# Patient Record
Sex: Male | Born: 1980 | Race: Black or African American | Hispanic: No | Smoking: Never smoker
Health system: Southern US, Community
[De-identification: ages and names within clinical notes are randomized; demographics above are authoritative.]

## PROBLEM LIST (undated history)

## (undated) HISTORY — PX: HERNIA REPAIR: SHX51

---

## 2005-03-01 ENCOUNTER — Emergency Department (HOSPITAL_COMMUNITY): Admission: EM | Admit: 2005-03-01 | Discharge: 2005-03-01 | Payer: Self-pay | Admitting: Family Medicine

## 2005-12-09 ENCOUNTER — Emergency Department (HOSPITAL_COMMUNITY): Admission: EM | Admit: 2005-12-09 | Discharge: 2005-12-10 | Payer: Self-pay | Admitting: Emergency Medicine

## 2006-12-27 ENCOUNTER — Ambulatory Visit: Payer: Self-pay | Admitting: *Deleted

## 2006-12-27 ENCOUNTER — Ambulatory Visit: Payer: Self-pay | Admitting: Internal Medicine

## 2007-02-14 ENCOUNTER — Emergency Department (HOSPITAL_COMMUNITY): Admission: EM | Admit: 2007-02-14 | Discharge: 2007-02-14 | Payer: Self-pay | Admitting: *Deleted

## 2007-05-13 ENCOUNTER — Emergency Department (HOSPITAL_COMMUNITY): Admission: EM | Admit: 2007-05-13 | Discharge: 2007-05-13 | Payer: Self-pay | Admitting: Emergency Medicine

## 2007-05-14 ENCOUNTER — Emergency Department (HOSPITAL_COMMUNITY): Admission: EM | Admit: 2007-05-14 | Discharge: 2007-05-14 | Payer: Self-pay | Admitting: Emergency Medicine

## 2007-06-13 ENCOUNTER — Emergency Department (HOSPITAL_COMMUNITY): Admission: EM | Admit: 2007-06-13 | Discharge: 2007-06-13 | Payer: Self-pay | Admitting: Emergency Medicine

## 2007-06-15 ENCOUNTER — Emergency Department (HOSPITAL_COMMUNITY): Admission: EM | Admit: 2007-06-15 | Discharge: 2007-06-15 | Payer: Self-pay | Admitting: Emergency Medicine

## 2007-06-16 ENCOUNTER — Inpatient Hospital Stay (HOSPITAL_COMMUNITY): Admission: EM | Admit: 2007-06-16 | Discharge: 2007-06-20 | Payer: Self-pay | Admitting: Emergency Medicine

## 2007-06-16 ENCOUNTER — Ambulatory Visit: Payer: Self-pay | Admitting: Internal Medicine

## 2007-06-27 ENCOUNTER — Ambulatory Visit: Payer: Self-pay | Admitting: Internal Medicine

## 2007-06-27 DIAGNOSIS — L0292 Furuncle, unspecified: Secondary | ICD-10-CM | POA: Insufficient documentation

## 2007-06-27 DIAGNOSIS — L0293 Carbuncle, unspecified: Secondary | ICD-10-CM

## 2007-07-07 ENCOUNTER — Ambulatory Visit: Payer: Self-pay | Admitting: Internal Medicine

## 2007-10-20 ENCOUNTER — Emergency Department (HOSPITAL_COMMUNITY): Admission: EM | Admit: 2007-10-20 | Discharge: 2007-10-20 | Payer: Self-pay | Admitting: Emergency Medicine

## 2010-10-28 NOTE — Discharge Summary (Signed)
[name], Ramirez               ACCOUNT NO.:  [id number]   MEDICAL RECORD NO.:  [id number]          PATIENT TYPE:  INP   LOCATION:  6730                         FACILITY:  [facility]   PHYSICIAN:  Zara Council, MD      DATE OF BIRTH:  May 08, 1981   DATE OF ADMISSION:  06/16/2007  DATE OF DISCHARGE:  06/20/2007                               DISCHARGE SUMMARY   DISCHARGE DIAGNOSES:  1. Right axillary abscess.  2. Recurrent boils.  3. Borderline elevated creatinine with a family history of systemic      lupus erythematosus.  4. Hypertension most likely secondary to pain, resolved on discharge.   DISCHARGE MEDICATIONS:  1. Bactrim DS 2 tablets twice a day for 1 week.  2. Vicodin 5/325 mg 1 - 2 tab up to four times a day for pain.   DISPOSITION AND FOLLOWUP:  1. The patient is to follow up with Dr. Gerrit Friends, phone number [phone]      for a followup on I&D.  2. The patient is to follow up at the [facility] with Dr. Polly Cobia on Monday, June 27, 2007.  At the      followup visit, the patient is to be reviewed for resolution of his      right axillary abscess.  At the followup visit the total duration      of the antibiotics will be determined.   PROCEDURES:  No special procedures were undertaken during this admission  in regards to radiological studies.   The patient underwent right axillary abscess incision and drainage with  open packing on June 16, 2007, under Dr. Velora Heckler.  The  procedure was done at the bedside in the ER at [facility].   CONSULTATIONS:  A surgical consultation was made with Dr. Velora Heckler  for right axillary abscess.  As noted above, the patient subsequently  underwent incision and drainage with open packing.   ADMISSION HISTORY:  Mr. Stanley Ramirez is a 30 year old gentleman with no  significant past medical history, who came to the ED 3 days prior to  admission for drainage of a right axillary abscess that had  appeared the  day before.  At that time, it was I&D'd and packed.  He was sent home on  Bactrim.  He came back again 1 day prior to admission for removal of  packing.  The area seemed enlarged and was still draining with  surrounding erythema.  He was given 1 dose of vancomycin, and his  Bactrim dose was doubled, and his culture had grown methicillin-  sensitive Staphylococcus aureus, sensitive to Bactrim.  He came back on  the day of admission with no improvement and moderate pain.  He still  had pigtail drainage, and he had mild subjective fever but no chills,  and he was complaining of headaches.   ADMISSION PHYSICAL:  VITAL SIGNS:  Temperature 99, blood pressure  145/89, pulse 102, and respiratory rate 20.  Oxygen sat 98% on room air.  GENERAL:  NAD.  Holding right arm above his  head.  EYES:  PERRLA.  EOMI.  Anicteric.  NECK:  Supple.  CHEST:  Clear to auscultation bilaterally.  Good air movement.  CARDIOVASCULAR:  Regular rate and rhythm.  No murmurs, rubs, or gallops.  ABDOMEN:  Soft.  Bowel sounds positive.  Nontender and nondistended.  EXTREMITIES:  No edema.  Right axilla 5- x 2-cm induration with  fluctuant area with surrounding erythema and warmth.  Two small boils in  the left axilla.  There are no signs of infection.  One new boil on the  right side of the chest with drainage.   ADMISSION LABS:  White cell 10.8, ANC 6.1, hemoglobin 14.3, and platelet  297,000.  Sodium 138, potassium 3.7, chloride 108, bicarbonate 22, BUN  10, creatinine 1.2, and blood glucose 94.   HOSPITAL COURSE BY PROBLEM:  1. Right axillary abscess:  As noted above, the patient underwent      incision and drainage in the ED under Dr. Gerrit Friends following which he      was placed on Bactrim.  The wound care was also provided.  The      patient responded well.  2. Borderline creatinine:  This was improved while he was in the      hospital but given his family history of SLE, ANA test was      requested,  which came out to be negative.  3. Hypertension:  This was most likely secondary to pain and upon      giving adequate analgesic medications, his blood pressure came down      to normal limits.   DISCHARGE VITALS:  Temperature 98.1, blood pressure 108/68, pulse 77,  and respiratory rate 19.  O2 sat 98% on room air.   DISCHARGE LABS:  Hemoglobin 14.2, white cell 8.6, and platelet 314,000.  Sodium 139, potassium 4.1, chloride 105, bicarbonate 27, BUN 12,  creatinine 1.2, and blood glucose 91.      Zara Council, MD  Electronically Signed     AS/MEDQ  D:  06/22/2007  T:  06/23/2007  Job:  [id number]

## 2010-10-28 NOTE — Consult Note (Signed)
NAMEELDO, Stanley Ramirez               ACCOUNT NO.:  [id number]   MEDICAL RECORD NO.:  [id number]          PATIENT TYPE:  INP   LOCATION:  6730                         FACILITY:  [facility]   PHYSICIAN:  Velora Heckler, MD      DATE OF BIRTH:  08/15/1980   DATE OF CONSULTATION:  DATE OF DISCHARGE:                                 CONSULTATION   REFERRING PHYSICIAN:  Internal Medicine Teaching Service.   CHIEF COMPLAINT:  Right axillary abscess.   HISTORY OF PRESENT ILLNESS:  The patient is a 30 year old black male  presents the emergency department with recurrent right axillary abscess.  The patient had been seen in the emergency department on December 28.  He underwent incision, drainage, and open packing.  He was placed on  oral antibiotics.  The patient returns to the emergency department with  persistent pain and swelling in the axilla.   PAST MEDICAL HISTORY:  None.   MEDICATIONS:  Bactrim and Vicodin.   ALLERGIES:  NONE KNOWN.   SOCIAL HISTORY:  The patient denies alcohol or drug abuse.  He does  smoke.   FAMILY HISTORY:  Noncontributory.   REVIEW OF SYSTEMS:  A 15 system review otherwise negative.   PHYSICAL EXAMINATION:  GENERAL:  A 30 year old mildly obese black male  in no acute distress.  Temperature 99.0, blood pressure 145/89, pulse  103, respirations 20.  HEENT:  Shows to be normocephalic.  Sclerae  clear.  Conjunctivae clear.  Dentition good.  Mucous membranes moist.  Voice normal.  Examination of the right axilla shows an indurated mass  in the upper axilla measuring approximately 4 cm in diameter.  There is  a 3 mm superficial laceration with serous drainage.  There is moderate  tenderness.   IMPRESSION:  Persistent right axillary abscess, methicillin-sensitive  staph aureus infection.   PLAN:  1. Repeat incision and drainage and open packing at the bedside.  2. Antibiotic therapy per medical service.  3. Remove packing in 48 hours and continue local wound  care.  4. Call surgery as needed.      Velora Heckler, MD  Electronically Signed     TMG/MEDQ  D:  06/16/2007  T:  06/16/2007  Job:  [id number]   cc:   Velora Heckler, MD

## 2010-10-28 NOTE — Op Note (Signed)
Stanley Ramirez, Stanley Ramirez               ACCOUNT NO.:  [id number]   MEDICAL RECORD NO.:  [id number]          PATIENT TYPE:  INP   LOCATION:  1824                         FACILITY:  [facility]   PHYSICIAN:  Velora Heckler, MD      DATE OF BIRTH:  1980-12-03   DATE OF PROCEDURE:  06/16/2007  DATE OF DISCHARGE:                               OPERATIVE REPORT   PREOPERATIVE DIAGNOSIS:  Right axillary abscess.   POSTOPERATIVE DIAGNOSIS:  Right axillary abscess.   PROCEDURE:  Incision, drainage and open packing of right axillary  abscess.   SURGEON:  Velora Heckler, M.D., FACS   ANESTHESIA:  1% lidocaine local.   ESTIMATED BLOOD LOSS:  Minimal.   PREPARATION:  Betadine.   COMPLICATIONS:  None.   INDICATIONS:  The patient is 30 year old black male with recurrent Staph  aureus abscess in the right axilla.  He presents to the emergency room  for evaluation.  The Medical Service called Surgery for repeat incision,  drainage and open packing.   BODY OF REPORT:  The procedure was done at the bedside in ER bay #15 at  the [facility].  The axilla was shaved.  The skin  was prepped with Betadine.  The skin was anesthetized with 1% lidocaine  local anesthetic.  A 2 cm incision was made with a #11-blade into the  abscess cavity.  The cavity is packed with quarter-inch Iodoform gauze  packing.  Dry gauze dressings were placed as a dressing.  The patient  tolerated the procedure well.      Velora Heckler, MD  Electronically Signed     TMG/MEDQ  D:  06/16/2007  T:  06/16/2007  Job:  [id number]

## 2011-03-05 LAB — URINE MICROSCOPIC-ADD ON

## 2011-03-05 LAB — BASIC METABOLIC PANEL
BUN: 8
BUN: 9
CO2: 22
CO2: 24
CO2: 27
Calcium: 9.1
Chloride: 105
Chloride: 96
Creatinine, Ser: 1.03
GFR calc Af Amer: >60
GFR calc non Af Amer: >60
GFR calc non Af Amer: >60
Glucose, Bld: 126 — ABNORMAL HIGH
Glucose, Bld: 95
Potassium: 3.6
Potassium: 3.9
Potassium: 4
Potassium: 4.1
Sodium: 138
Sodium: 139

## 2011-03-05 LAB — I-STAT 8, (EC8 V) (CONVERTED LAB)
Chloride: 108
Glucose, Bld: 94
Potassium: 3.7
pCO2, Ven: 36.8 — ABNORMAL LOW
pH, Ven: 7.384 — ABNORMAL HIGH

## 2011-03-05 LAB — DIFFERENTIAL
Basophils Relative: 1
Eosinophils Absolute: 0.4
Eosinophils Relative: 4
Lymphs Abs: 3.4
Monocytes Relative: 8
Neutrophils Relative %: 56

## 2011-03-05 LAB — URINALYSIS, ROUTINE W REFLEX MICROSCOPIC
Bilirubin Urine: NEGATIVE
Ketones, ur: NEGATIVE
Nitrite: NEGATIVE
Protein, ur: NEGATIVE
pH: 6

## 2011-03-05 LAB — CULTURE, BLOOD (ROUTINE X 2): Culture: NO GROWTH

## 2011-03-05 LAB — CBC
HCT: 41.5
HCT: 42.6
HCT: 43.2
Hemoglobin: 14.2
MCHC: 33.5
MCHC: 33.8
MCV: 84.3
MCV: 85.1
Platelets: 297
Platelets: 302
RBC: 5.02
WBC: 10.8 — ABNORMAL HIGH
WBC: 8.6
WBC: 8.8

## 2011-03-05 LAB — POCT I-STAT CREATININE
Creatinine, Ser: 1.2
Operator id: 285491

## 2011-03-05 LAB — HIV ANTIBODY (ROUTINE TESTING W REFLEX): HIV: NONREACTIVE

## 2011-03-20 LAB — CULTURE, ROUTINE-ABSCESS

## 2020-02-03 ENCOUNTER — Encounter (HOSPITAL_BASED_OUTPATIENT_CLINIC_OR_DEPARTMENT_OTHER): Payer: Self-pay | Admitting: Emergency Medicine

## 2020-02-03 ENCOUNTER — Other Ambulatory Visit: Payer: Self-pay

## 2020-02-03 ENCOUNTER — Emergency Department (HOSPITAL_BASED_OUTPATIENT_CLINIC_OR_DEPARTMENT_OTHER): Payer: Medicaid Other

## 2020-02-03 ENCOUNTER — Observation Stay (HOSPITAL_BASED_OUTPATIENT_CLINIC_OR_DEPARTMENT_OTHER)
Admission: EM | Admit: 2020-02-03 | Discharge: 2020-02-05 | Disposition: A | Discharge status: Home / Self Care | Payer: Medicaid Other | Attending: Emergency Medicine | Admitting: Emergency Medicine

## 2020-02-03 DIAGNOSIS — Z20822 Contact with and (suspected) exposure to covid-19: Secondary | ICD-10-CM | POA: Diagnosis not present

## 2020-02-03 DIAGNOSIS — R600 Localized edema: Secondary | ICD-10-CM | POA: Diagnosis present

## 2020-02-03 DIAGNOSIS — J9621 Acute and chronic respiratory failure with hypoxia: Secondary | ICD-10-CM | POA: Diagnosis present

## 2020-02-03 DIAGNOSIS — R2243 Localized swelling, mass and lump, lower limb, bilateral: Principal | ICD-10-CM | POA: Insufficient documentation

## 2020-02-03 DIAGNOSIS — Z6841 Body Mass Index (BMI) 40.0 and over, adult: Secondary | ICD-10-CM | POA: Insufficient documentation

## 2020-02-03 DIAGNOSIS — R030 Elevated blood-pressure reading, without diagnosis of hypertension: Secondary | ICD-10-CM | POA: Diagnosis present

## 2020-02-03 DIAGNOSIS — J9620 Acute and chronic respiratory failure, unspecified whether with hypoxia or hypercapnia: Secondary | ICD-10-CM | POA: Diagnosis not present

## 2020-02-03 DIAGNOSIS — R7989 Other specified abnormal findings of blood chemistry: Secondary | ICD-10-CM | POA: Diagnosis present

## 2020-02-03 DIAGNOSIS — R0600 Dyspnea, unspecified: Secondary | ICD-10-CM | POA: Diagnosis present

## 2020-02-03 DIAGNOSIS — L0390 Cellulitis, unspecified: Secondary | ICD-10-CM

## 2020-02-03 DIAGNOSIS — M7989 Other specified soft tissue disorders: Secondary | ICD-10-CM

## 2020-02-03 LAB — CBC
HCT: 44.5 % (ref 39.0–52.0)
Hemoglobin: 14.2 g/dL (ref 13.0–17.0)
MCH: 27.9 pg (ref 26.0–34.0)
MCHC: 31.9 g/dL (ref 30.0–36.0)
MCV: 87.4 fL (ref 80.0–100.0)
Platelets: 256 10*3/uL (ref 150–400)
RBC: 5.09 MIL/uL (ref 4.22–5.81)
RDW: 13.7 % (ref 11.5–15.5)
WBC: 7 10*3/uL (ref 4.0–10.5)
nRBC: 0 % (ref 0.0–0.2)

## 2020-02-03 LAB — BASIC METABOLIC PANEL
Anion gap: 11 (ref 5–15)
BUN: 13 mg/dL (ref 6–20)
CO2: 27 mmol/L (ref 22–32)
Calcium: 8.8 mg/dL — ABNORMAL LOW (ref 8.9–10.3)
Chloride: 100 mmol/L (ref 98–111)
Creatinine, Ser: 0.82 mg/dL (ref 0.61–1.24)
GFR calc Af Amer: >60 mL/min (ref >60)
GFR calc non Af Amer: >60 mL/min (ref >60)
Glucose, Bld: 101 mg/dL — ABNORMAL HIGH (ref 70–99)
Potassium: 3.7 mmol/L (ref 3.5–5.1)
Sodium: 138 mmol/L (ref 135–145)

## 2020-02-03 LAB — TROPONIN I (HIGH SENSITIVITY): Troponin I (High Sensitivity): <2 ng/L (ref <18)

## 2020-02-03 LAB — BRAIN NATRIURETIC PEPTIDE: B Natriuretic Peptide: 38.1 pg/mL (ref 0.0–100.0)

## 2020-02-03 NOTE — ED Triage Notes (Addendum)
SOB and leg swelling for a couple of weeks. Denies chest pain. Pt falling asleep and snoring during triage.

## 2020-02-04 ENCOUNTER — Emergency Department (HOSPITAL_BASED_OUTPATIENT_CLINIC_OR_DEPARTMENT_OTHER): Payer: Medicaid Other

## 2020-02-04 ENCOUNTER — Encounter (HOSPITAL_BASED_OUTPATIENT_CLINIC_OR_DEPARTMENT_OTHER): Payer: Self-pay | Admitting: Emergency Medicine

## 2020-02-04 ENCOUNTER — Observation Stay (HOSPITAL_BASED_OUTPATIENT_CLINIC_OR_DEPARTMENT_OTHER): Payer: Medicaid Other

## 2020-02-04 DIAGNOSIS — R0683 Snoring: Secondary | ICD-10-CM

## 2020-02-04 DIAGNOSIS — Z9189 Other specified personal risk factors, not elsewhere classified: Secondary | ICD-10-CM | POA: Diagnosis not present

## 2020-02-04 DIAGNOSIS — R030 Elevated blood-pressure reading, without diagnosis of hypertension: Secondary | ICD-10-CM | POA: Diagnosis not present

## 2020-02-04 DIAGNOSIS — Z6841 Body Mass Index (BMI) 40.0 and over, adult: Secondary | ICD-10-CM | POA: Diagnosis not present

## 2020-02-04 DIAGNOSIS — Z20822 Contact with and (suspected) exposure to covid-19: Secondary | ICD-10-CM | POA: Diagnosis not present

## 2020-02-04 DIAGNOSIS — R2243 Localized swelling, mass and lump, lower limb, bilateral: Secondary | ICD-10-CM | POA: Diagnosis present

## 2020-02-04 DIAGNOSIS — I2699 Other pulmonary embolism without acute cor pulmonale: Secondary | ICD-10-CM | POA: Diagnosis not present

## 2020-02-04 DIAGNOSIS — R7989 Other specified abnormal findings of blood chemistry: Secondary | ICD-10-CM | POA: Diagnosis not present

## 2020-02-04 DIAGNOSIS — J9620 Acute and chronic respiratory failure, unspecified whether with hypoxia or hypercapnia: Secondary | ICD-10-CM | POA: Diagnosis not present

## 2020-02-04 DIAGNOSIS — L03116 Cellulitis of left lower limb: Secondary | ICD-10-CM | POA: Insufficient documentation

## 2020-02-04 DIAGNOSIS — R0602 Shortness of breath: Secondary | ICD-10-CM

## 2020-02-04 LAB — TROPONIN I (HIGH SENSITIVITY): Troponin I (High Sensitivity): 2 ng/L (ref <18)

## 2020-02-04 LAB — HEMOGLOBIN A1C
Hgb A1c MFr Bld: 6 % — ABNORMAL HIGH (ref 4.8–5.6)
Mean Plasma Glucose: 125.5 mg/dL

## 2020-02-04 LAB — HIV ANTIBODY (ROUTINE TESTING W REFLEX): HIV Screen 4th Generation wRfx: NONREACTIVE

## 2020-02-04 LAB — CBC
HCT: 46.5 % (ref 39.0–52.0)
Hemoglobin: 15.1 g/dL (ref 13.0–17.0)
MCH: 28.3 pg (ref 26.0–34.0)
MCHC: 32.5 g/dL (ref 30.0–36.0)
MCV: 87.2 fL (ref 80.0–100.0)
Platelets: 280 10*3/uL (ref 150–400)
RBC: 5.33 MIL/uL (ref 4.22–5.81)
RDW: 14 % (ref 11.5–15.5)
WBC: 7.3 10*3/uL (ref 4.0–10.5)
nRBC: 0 % (ref 0.0–0.2)

## 2020-02-04 LAB — SARS CORONAVIRUS 2 BY RT PCR (HOSPITAL ORDER, PERFORMED IN ~~LOC~~ HOSPITAL LAB): SARS Coronavirus 2: NEGATIVE

## 2020-02-04 LAB — HEPARIN LEVEL (UNFRACTIONATED)
Heparin Unfractionated: 0.29 IU/mL — ABNORMAL LOW (ref 0.30–0.70)
Heparin Unfractionated: 0.17 IU/mL — ABNORMAL LOW (ref 0.30–0.70)
Heparin Unfractionated: 0.19 IU/mL — ABNORMAL LOW (ref 0.30–0.70)

## 2020-02-04 LAB — TSH: TSH: 2.214 u[IU]/mL (ref 0.350–4.500)

## 2020-02-04 LAB — D-DIMER, QUANTITATIVE: D-Dimer, Quant: 0.71 ug/mL-FEU — ABNORMAL HIGH (ref 0.00–0.50)

## 2020-02-04 MED ORDER — SENNOSIDES-DOCUSATE SODIUM 8.6-50 MG PO TABS: 1.0000 | ORAL_TABLET | Freq: Every evening | ORAL | Status: DC | PRN

## 2020-02-04 MED ORDER — HEPARIN BOLUS VIA INFUSION
2500.0000 [IU] | Freq: Once | INTRAVENOUS | Status: AC
  Administered 2020-02-04: 2500 [IU] via INTRAVENOUS
  Filled 2020-02-04: qty 2500

## 2020-02-04 MED ORDER — PIPERACILLIN-TAZOBACTAM 3.375 G IVPB 30 MIN
3.3750 g | Freq: Once | INTRAVENOUS | Status: AC
  Administered 2020-02-04: 3.375 g via INTRAVENOUS
  Filled 2020-02-04 (×2): qty 50

## 2020-02-04 MED ORDER — ONDANSETRON HCL 4 MG/2ML IJ SOLN: 4.0000 mg | Freq: Four times a day (QID) | INTRAMUSCULAR | Status: DC | PRN

## 2020-02-04 MED ORDER — IOHEXOL 350 MG/ML SOLN
100.0000 mL | Freq: Once | INTRAVENOUS | Status: AC | PRN
  Administered 2020-02-04: 100 mL via INTRAVENOUS

## 2020-02-04 MED ORDER — DOCUSATE SODIUM 100 MG PO CAPS
100.0000 mg | ORAL_CAPSULE | Freq: Two times a day (BID) | ORAL | Status: DC
  Filled 2020-02-04: qty 1

## 2020-02-04 MED ORDER — VANCOMYCIN HCL IN DEXTROSE 1-5 GM/200ML-% IV SOLN
1000.0000 mg | Freq: Once | INTRAVENOUS | Status: AC
  Administered 2020-02-04: 1000 mg via INTRAVENOUS
  Filled 2020-02-04: qty 200

## 2020-02-04 MED ORDER — HEPARIN (PORCINE) 25000 UT/250ML-% IV SOLN
2600.0000 [IU]/h | INTRAVENOUS | Status: DC
  Administered 2020-02-04: 2200 [IU]/h via INTRAVENOUS
  Administered 2020-02-05: 2600 [IU]/h via INTRAVENOUS
  Filled 2020-02-04 (×2): qty 250

## 2020-02-04 MED ORDER — SODIUM CHLORIDE 0.9 % IV SOLN
INTRAVENOUS | Status: DC | PRN
  Administered 2020-02-04: 500 mL via INTRAVENOUS

## 2020-02-04 MED ORDER — ALBUTEROL SULFATE (2.5 MG/3ML) 0.083% IN NEBU: 2.5000 mg | INHALATION_SOLUTION | RESPIRATORY_TRACT | Status: DC | PRN

## 2020-02-04 MED ORDER — FUROSEMIDE 10 MG/ML IJ SOLN
40.0000 mg | Freq: Once | INTRAMUSCULAR | Status: AC
  Administered 2020-02-04: 40 mg via INTRAVENOUS
  Filled 2020-02-04: qty 4

## 2020-02-04 MED ORDER — HEPARIN BOLUS VIA INFUSION
7000.0000 [IU] | Freq: Once | INTRAVENOUS | Status: AC
  Administered 2020-02-04: 7000 [IU] via INTRAVENOUS

## 2020-02-04 MED ORDER — ONDANSETRON HCL 4 MG PO TABS: 4.0000 mg | ORAL_TABLET | Freq: Four times a day (QID) | ORAL | Status: DC | PRN

## 2020-02-04 MED ORDER — BISACODYL 10 MG RE SUPP: 10.0000 mg | Freq: Every day | RECTAL | Status: DC | PRN

## 2020-02-04 MED ORDER — HEPARIN (PORCINE) 25000 UT/250ML-% IV SOLN: 12.0000 [IU]/kg/h | INTRAVENOUS | Status: DC

## 2020-02-04 MED ORDER — HEPARIN (PORCINE) 25000 UT/250ML-% IV SOLN
2000.0000 [IU]/h | INTRAVENOUS | Status: DC
  Administered 2020-02-04: 2000 [IU]/h via INTRAVENOUS
  Filled 2020-02-04: qty 250

## 2020-02-04 MED ORDER — ACETAMINOPHEN 325 MG PO TABS
ORAL_TABLET | ORAL | Status: AC
  Filled 2020-02-04: qty 2

## 2020-02-04 MED ORDER — ZOLPIDEM TARTRATE 5 MG PO TABS: 5.0000 mg | ORAL_TABLET | Freq: Every evening | ORAL | Status: DC | PRN

## 2020-02-04 MED ORDER — ACETAMINOPHEN 325 MG PO TABS
650.0000 mg | ORAL_TABLET | Freq: Once | ORAL | Status: AC
  Administered 2020-02-04: 650 mg via ORAL

## 2020-02-04 NOTE — ED Notes (Signed)
Pt now states he is agreeable to staying in the hospital. Heparin started

## 2020-02-04 NOTE — ED Notes (Signed)
Ambulated patient on pulse ox, SpO2 99% with HR of 82.

## 2020-02-04 NOTE — Progress Notes (Signed)
ANTICOAGULATION CONSULT NOTE  Pharmacy Consult for heparin Indication: rule out VTE  No Known Allergies  Patient Measurements: Height: 5\' 7"  (170.2 cm) Weight: (!) 195.1 kg (430 lb 3.2 oz) IBW/kg (Calculated) : 66.1 Heparin Dosing Weight: 117 kg  Vital Signs: Temp: 98.3 F (36.8 C) (08/22 2057) Temp Source: Oral (08/22 2057) BP: 136/90 (08/22 2057) Pulse Rate: 107 (08/22 2057)  Labs: Recent Labs    02/03/20 1716 02/04/20 0022 02/04/20 1153 02/04/20 1552 02/04/20 2114  HGB 14.2  --   --   --  15.1  HCT 44.5  --   --   --  46.5  PLT 256  --   --   --  280  HEPARINUNFRC  --   --  0.29* 0.17* 0.19*  CREATININE 0.82  --   --   --   --   TROPONINIHS <2 2  --   --   --     Estimated Creatinine Clearance: 199.4 mL/min (by C-G formula based on SCr of 0.82 mg/dL).   Medical History: History reviewed. No pertinent past medical history.  Medications: No anticoagulants PTA  Assessment: Pt is a 39 yoM presenting with SOB and leg swelling. PMH significant for morbid obesity. Heparin initiated for suspected DVT and PE.   CTA Chest: Cannot exclude PE Venous dopplar: Ordered  Baseline CBC WNL  Today, 02/04/20  HL = 0.19 is SUBtherapeutic on heparin infusion of 2200 units/hr  CBC WNL  Confirmed with RN that heparin infusing correctly. No signs of bleeding.  Goal of Therapy:  Heparin level 0.3-0.7 units/ml Monitor platelets by anticoagulation protocol: Yes   Plan:   Rebolus heparin 2500 units IV x1 then increase heparin infusion to 2600 units/hr  Check HL & CBC in 6 hours  CBC, HL daily while on heparin  Monitor for signs/symptoms of bleeding  F/U dopplers to r/o DVT  02/06/20, PharmD 02/04/2020,10:56 PM

## 2020-02-04 NOTE — Progress Notes (Signed)
ANTICOAGULATION CONSULT NOTE - Initial Consult  Pharmacy Consult for heparin Indication: rule out VTE  No Known Allergies  Patient Measurements: Height: 5\' 7"  (170.2 cm) Weight: (!) 196.7 kg (433 lb 10.3 oz) IBW/kg (Calculated) : 66.1 Heparin Dosing Weight: 117 kg  Vital Signs: Temp: 98.7 F (37.1 C) (08/22 1044) Temp Source: Oral (08/22 1044) BP: 142/86 (08/22 1044) Pulse Rate: 83 (08/22 1044)  Labs: Recent Labs    02/03/20 1716 02/04/20 0022 02/04/20 1153  HGB 14.2  --   --   HCT 44.5  --   --   PLT 256  --   --   HEPARINUNFRC  --   --  0.29*  CREATININE 0.82  --   --   TROPONINIHS <2 2  --     Estimated Creatinine Clearance: 200.4 mL/min (by C-G formula based on SCr of 0.82 mg/dL).   Medical History: History reviewed. No pertinent past medical history.  Medications: No anticoagulants PTA  Assessment: Pt is a 39 yoM presenting with SOB and leg swelling. PMH significant for morbid obesity. Heparin initiated for suspected DVT and PE.   CTA Chest: Cannot exclude PE Venous dopplar: Ordered  Baseline CBC WNL  Today, 02/04/20  HL = 0.29 is slightly subtherapeutic on heparin infusion of 2000 units/hr  Confirmed with RN that heparin infusing correctly. No signs of bleeding.  Goal of Therapy:  Heparin level 0.3-0.7 units/ml Monitor platelets by anticoagulation protocol: Yes   Plan:   Increase heparin infusion to 2200 units/hr  Check HL & CBC in 6 hours  CBC, HL daily while on heparin  Monitor for signs/symptoms of bleeding  02/06/20, PharmD 02/04/2020,1:22 PM

## 2020-02-04 NOTE — ED Notes (Signed)
Unable to speak to Charge nurse at [facility] ED, message given to secretary to return call to nurse . Will call back. Report given to hazel RN with [facility]

## 2020-02-04 NOTE — ED Notes (Signed)
Report given to Asher Muir RN charge nurse at [facility] ED

## 2020-02-04 NOTE — Progress Notes (Signed)
ANTICOAGULATION CONSULT NOTE - Initial Consult  Pharmacy Consult for heparin Indication: r/o VTE  No Known Allergies  Patient Measurements: Height: 5\' 7"  (170.2 cm) Weight: (!) 196.7 kg (433 lb 10.3 oz) IBW/kg (Calculated) : 66.1 Heparin Dosing Weight: 120kg  Vital Signs: Temp: 98.5 F (36.9 C) (08/21 2124) Temp Source: Oral (08/21 2124) BP: 111/54 (08/22 0317) Pulse Rate: 79 (08/22 0317)  Labs: Recent Labs    02/03/20 1716 02/04/20 0022  HGB 14.2  --   HCT 44.5  --   PLT 256  --   CREATININE 0.82  --   TROPONINIHS <2 2    Estimated Creatinine Clearance: 200.4 mL/min (by C-G formula based on SCr of 0.82 mg/dL).    Assessment: 39yo male c/o SOB and leg swelling, D-dimer elevated, CTA limited but cannot r/o PE, to begin heparin.  Goal of Therapy:  Heparin level 0.3-0.7 units/ml Monitor platelets by anticoagulation protocol: Yes   Plan:  Will give heparin 7000 units IV bolus x1 followed by gtt at 2000 units/hr and monitor heparin levels and CBC.  40yo, PharmD, BCPS  02/04/2020,4:07 AM

## 2020-02-04 NOTE — ED Notes (Signed)
All valuables kept with pt. Shorts, keys, shirt, cell phone , Consulting civil engineer, tennis shoes

## 2020-02-04 NOTE — ED Notes (Signed)
Called to give report to nurse for 1421 at [facility], unable to take report

## 2020-02-04 NOTE — ED Notes (Signed)
Sleeping, snoring loudly

## 2020-02-04 NOTE — ED Provider Notes (Addendum)
[facility] EMERGENCY DEPARTMENT Provider Note   CSN: [id number] Arrival date & time: 02/03/20  1414     History Chief Complaint  Patient presents with  . Shortness of Breath    Stanley Ramirez is a 39 y.o. male.  The history is provided by the patient.  Shortness of Breath Severity:  Moderate Onset quality:  Gradual Duration:  2 weeks Timing:  Constant Progression:  Worsening Chronicity:  New Context: not URI   Relieved by:  Nothing Worsened by:  Nothing Ineffective treatments:  None tried Associated symptoms: no cough, no diaphoresis, no fever, no sore throat and no vomiting   Associated symptoms comment:  Leg swelling B, L> R Risk factors: no recent alcohol use   Patient presents with shortness of breath and leg swelling for approximately 2 weeks.  No f/c/r.  No cough, no congestion.       History reviewed. No pertinent past medical history.  Patient Active Problem List   Diagnosis Date Noted  . 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 HERNIA REPAIR         History reviewed. No pertinent family history.  Social History   Tobacco Use  . Smoking status: Never Smoker  . Smokeless tobacco: Never Used  Substance Use Topics  . Alcohol use: Not Currently  . Drug use: Not on file    Home Medications Prior to Admission medications   Not on File    Allergies    Patient has no known allergies.  Review of Systems   Review of Systems  Constitutional: Negative for diaphoresis and fever.  HENT: Negative for sore throat.   Eyes: Negative for visual disturbance.  Respiratory: Positive for shortness of breath. Negative for cough.   Cardiovascular: Positive for leg swelling.  Gastrointestinal: Negative for vomiting.  Genitourinary: Negative for difficulty urinating.  Musculoskeletal: Positive for arthralgias.  Skin: Negative for wound.  Neurological: Negative for facial asymmetry.    Psychiatric/Behavioral: Negative for agitation.  All other systems reviewed and are negative.   Physical Exam Updated Vital Signs BP (!) 143/63 (BP Location: Right Arm)   Pulse 72   Temp 98.5 F (36.9 C) (Oral)   Resp 20   Ht 5\' 7"  (1.702 m)   Wt (!) 196.7 kg   SpO2 98%   BMI 67.92 kg/m   Physical Exam Vitals and nursing note reviewed.  Constitutional:      Appearance: Normal appearance. He is not diaphoretic.  HENT:     Head: Normocephalic and atraumatic.     Nose: Nose normal.  Eyes:     Conjunctiva/sclera: Conjunctivae normal.     Pupils: Pupils are equal, round, and reactive to light.  Cardiovascular:     Rate and Rhythm: Normal rate and regular rhythm.     Pulses: Normal pulses.     Heart sounds: Normal heart sounds.  Pulmonary:     Effort: No respiratory distress.     Breath sounds: Decreased air movement present. No wheezing.  Abdominal:     General: Abdomen is flat. Bowel sounds are normal.     Tenderness: There is no abdominal tenderness. There is no guarding.  Musculoskeletal:     Cervical back: Normal range of motion and neck supple.  Skin:    General: Skin is warm and dry.     Capillary Refill: Capillary refill takes less than 2 seconds.       Neurological:     General: No  focal deficit present.     Mental Status: He is alert and oriented to person, place, and time.  Psychiatric:        Mood and Affect: Mood normal.        Behavior: Behavior normal.     ED Results / Procedures / Treatments   Labs (all labs ordered are listed, but only abnormal results are displayed) Results for orders placed or performed during the hospital encounter of 02/03/20  SARS Coronavirus 2 by RT PCR (hospital order, performed in [facility] hospital lab) Nasopharyngeal Nasopharyngeal Swab   Specimen: Nasopharyngeal Swab  Result Value Ref Range   SARS Coronavirus 2 NEGATIVE NEGATIVE  Basic metabolic panel  Result Value Ref Range   Sodium 138 135 - 145 mmol/L    Potassium 3.7 3.5 - 5.1 mmol/L   Chloride 100 98 - 111 mmol/L   CO2 27 22 - 32 mmol/L   Glucose, Bld 101 (H) 70 - 99 mg/dL   BUN 13 6 - 20 mg/dL   Creatinine, Ser 4.50 0.61 - 1.24 mg/dL   Calcium 8.8 (L) 8.9 - 10.3 mg/dL   GFR calc non Af Amer >60 >60 mL/min   GFR calc Af Amer >60 >60 mL/min   Anion gap 11 5 - 15  CBC  Result Value Ref Range   WBC 7.0 4.0 - 10.5 K/uL   RBC 5.09 4.22 - 5.81 MIL/uL   Hemoglobin 14.2 13.0 - 17.0 g/dL   HCT 38.8 39 - 52 %   MCV 87.4 80.0 - 100.0 fL   MCH 27.9 26.0 - 34.0 pg   MCHC 31.9 30.0 - 36.0 g/dL   RDW 82.8 00.3 - 49.1 %   Platelets 256 150 - 400 K/uL   nRBC 0.0 0.0 - 0.2 %  Brain natriuretic peptide  Result Value Ref Range   B Natriuretic Peptide 38.1 0.0 - 100.0 pg/mL  D-dimer, quantitative (not at [facility])  Result Value Ref Range   D-Dimer, Quant 0.71 (H) 0.00 - 0.50 ug/mL-FEU  Troponin I (High Sensitivity)  Result Value Ref Range   Troponin I (High Sensitivity) <2 <18 ng/L  Troponin I (High Sensitivity)  Result Value Ref Range   Troponin I (High Sensitivity) 2 <18 ng/L   CT Angio Chest PE W and/or Wo Contrast  Result Date: 02/04/2020 CLINICAL DATA:  Dyspnea EXAM: CT ANGIOGRAPHY CHEST WITH CONTRAST TECHNIQUE: Multidetector CT imaging of the chest was performed using the standard protocol during bolus administration of intravenous contrast. Multiplanar CT image reconstructions and MIPs were obtained to evaluate the vascular anatomy. CONTRAST:  OMNIPAQUE IOHEXOL 350 MG/ML SOLN COMPARISON:  None. FINDINGS: Cardiovascular: There is suboptimal opacification of the central pulmonary arteries. As such, the examination is only capable of excluding intraluminal filling defect within the main and central right and left pulmonary arteries, of which there is none. The lobar, segmental, and subsegmental pulmonary arteries are not opacified adequately to reliably exclude the presence of small pulmonary emboli. Central pulmonary arterial caliber is  within normal limits. Cardiac size is within normal limits. No pericardial effusion. No significant coronary artery calcification. Thoracic aorta is unremarkable. Mediastinum/Nodes: No pathologic thoracic adenopathy. Thyroid unremarkable. Esophagus unremarkable. Lungs/Pleura: Lungs are clear. No pneumothorax or pleural effusion. Central airways are widely patent. Upper Abdomen: No acute abnormality. Musculoskeletal: No acute bone abnormality. Review of the MIP images confirms the above findings. IMPRESSION: 1. Suboptimal opacification of the central pulmonary arteries. As such, the examination is only capable of excluding intraluminal filling defect within  the main and central right and left pulmonary arteries, of which there is none. The lobar, segmental, and subsegmental pulmonary arteries are not adequately opacified to reliably exclude the presence of small pulmonary emboli. 2. Otherwise, unremarkable CT angiogram of the chest. Electronically Signed   By: Helyn Numbers MD   On: 02/04/2020 03:54   DG Chest Portable 1 View  Result Date: 02/03/2020 CLINICAL DATA:  Shortness of breath and lower extremity swelling for 2 weeks. EXAM: PORTABLE CHEST 1 VIEW COMPARISON:  None. FINDINGS: Lungs clear. Heart size normal. No pneumothorax or pleural fluid. No acute or focal bony abnormality. IMPRESSION: No acute disease. Electronically Signed   By: Drusilla Kanner M.D.   On: 02/03/2020 16:14    EKG EKG Interpretation  Date/Time:  Saturday February 03 2020 15:07:37 EDT Ventricular Rate:  94 PR Interval:  196 QRS Duration: 80 QT Interval:  356 QTC Calculation: 445 R Axis:   67 Text Interpretation: Normal sinus rhythm Low voltage QRS Confirmed by Nicanor Alcon, Hasana Alcorta ([id number]) on 02/04/2020 12:15:44 AM   Radiology CT Angio Chest PE W and/or Wo Contrast  Result Date: 02/04/2020 CLINICAL DATA:  Dyspnea EXAM: CT ANGIOGRAPHY CHEST WITH CONTRAST TECHNIQUE: Multidetector CT imaging of the chest was performed using the  standard protocol during bolus administration of intravenous contrast. Multiplanar CT image reconstructions and MIPs were obtained to evaluate the vascular anatomy. CONTRAST:  OMNIPAQUE IOHEXOL 350 MG/ML SOLN COMPARISON:  None. FINDINGS: Cardiovascular: There is suboptimal opacification of the central pulmonary arteries. As such, the examination is only capable of excluding intraluminal filling defect within the main and central right and left pulmonary arteries, of which there is none. The lobar, segmental, and subsegmental pulmonary arteries are not opacified adequately to reliably exclude the presence of small pulmonary emboli. Central pulmonary arterial caliber is within normal limits. Cardiac size is within normal limits. No pericardial effusion. No significant coronary artery calcification. Thoracic aorta is unremarkable. Mediastinum/Nodes: No pathologic thoracic adenopathy. Thyroid unremarkable. Esophagus unremarkable. Lungs/Pleura: Lungs are clear. No pneumothorax or pleural effusion. Central airways are widely patent. Upper Abdomen: No acute abnormality. Musculoskeletal: No acute bone abnormality. Review of the MIP images confirms the above findings. IMPRESSION: 1. Suboptimal opacification of the central pulmonary arteries. As such, the examination is only capable of excluding intraluminal filling defect within the main and central right and left pulmonary arteries, of which there is none. The lobar, segmental, and subsegmental pulmonary arteries are not adequately opacified to reliably exclude the presence of small pulmonary emboli. 2. Otherwise, unremarkable CT angiogram of the chest. Electronically Signed   By: Helyn Numbers MD   On: 02/04/2020 03:54   DG Chest Portable 1 View  Result Date: 02/03/2020 CLINICAL DATA:  Shortness of breath and lower extremity swelling for 2 weeks. EXAM: PORTABLE CHEST 1 VIEW COMPARISON:  None. FINDINGS: Lungs clear. Heart size normal. No pneumothorax or pleural  fluid. No acute or focal bony abnormality. IMPRESSION: No acute disease. Electronically Signed   By: Drusilla Kanner M.D.   On: 02/03/2020 16:14    Procedures Procedures (including critical care time)  Medications Ordered in ED Medications  vancomycin (VANCOCIN) IVPB 1000 mg/200 mL premix (has no administration in time range)  0.9 %  sodium chloride infusion (500 mLs Intravenous New Bag/Given 02/04/20 0350)  heparin bolus via infusion 7,000 Units (has no administration in time range)  heparin ADULT infusion 100 units/mL (25000 units/265mL sodium chloride 0.45%) (has no administration in time range)  piperacillin-tazobactam (ZOSYN) IVPB 3.375 g (3.375 g  Intravenous New Bag/Given 02/04/20 0350)  iohexol (OMNIPAQUE) 350 MG/ML injection 100 mL (100 mLs Intravenous Contrast Given 02/04/20 0329)    ED Course  I have reviewed the triage vital signs and the nursing notes.  Pertinent labs & imaging results that were available during my care of the patient were reviewed by me and considered in my medical decision making (see chart for details).    MDM Reviewed: nursing note and vitals Interpretation: labs, ECG, x-ray and CT scan (CXR no pulmonary edema by me negative troponins ) Total time providing critical care: 30-74 minutes. This excludes time spent performing separately reportable procedures and services. Consults: admitting MD  CRITICAL CARE Performed by: Atlee Kluth K Aahan Marques-Rasch Total critical care time: 60  minutes Critical care time was exclusive of separately billable procedures and treating other patients. Critical care was necessary to treat or prevent imminent or life-threatening deterioration. Critical care was time spent personally by me on the following activities: development of treatment plan with patient and/or surrogate as well as nursing, discussions with consultants, evaluation of patient's response to treatment, examination of patient, obtaining history from patient or  surrogate, ordering and performing treatments and interventions, ordering and review of laboratory studies, ordering and review of radiographic studies, pulse oximetry and re-evaluation of patient's condition.   I suspect this patient has a DVT and PE given the dyspnea but we have a markedly suboptimal PE study due to habitus.  I have started heparin and Patient will need DVT study.  I have given antibiotics for cellulitis, patient may have a thrombophlebitis.  Will admit to medicine for Rule out PE  Final Clinical Impression(s) / ED Diagnoses Final diagnoses:  Dyspnea, unspecified type  Cellulitis, unspecified cellulitis site   Admit to medicine     Tavaughn Silguero, MD 02/04/20 [id number]    Nicanor AlconPalumbo, Aiyonna Lucado, MD 02/04/20 [id number]    Nicanor AlconPalumbo, Brennan Litzinger, MD 02/04/20 [id number]

## 2020-02-04 NOTE — ED Notes (Addendum)
Pt declining hospital admission at this time, EDP made aware and is at bedside. Delay in heparin for this reason.

## 2020-02-04 NOTE — Progress Notes (Signed)
Bilateral lower extremity venous duplex completed. Refer to "CV Proc" under chart review to view preliminary results.  02/04/2020 4:30 PM Eula Fried., MHA, RVT, RDCS, RDMS

## 2020-02-04 NOTE — H&P (Signed)
History and Physical    Stanley Ramirez TIW:[id number] DOB: 1980-12-13 DOA: 02/03/2020  PCP: Patient, No Pcp Per Patient coming from: Home.  Chief Complaint: Leg swelling  HPI: Stanley Ramirez is a 39 y.o. male with history of morbid obesity, edema and snoring presenting to [facility] ED with bilateral lower extremity swelling.  Patient reports bilateral lower extremity swelling for 2 to 3 weeks that has gotten worse.  Swelling is more pronounced in the right lower extremity.  Pains and tender to touch.  Denies redness.  He is mostly on his feet or sitting at work.  He also reports increased shortness of breath over the last 1 week.  He denies orthopnea or PND.  Denies history of CHF.  He says he had bilateral lower extremity swelling in the past that improved with leg elevation.  He denies prolonged immobility, calf pain or history of blood clot.  He denies fever, chills, cough, runny nose, sore throat, chest pain, nausea, vomiting, abdominal pain, diarrhea or UTI symptoms.  Patient denies known COVID-19 exposure.  He is unvaccinated against COVID-19.  Patient denies family history of heart disease or blood clots.  Admits to smoking about 4 to 5 cigarettes a year.  Reports vaping.  Denies drinking alcohol recreational drug use.  In ED, slightly elevated BP.  RR 24> 20.  Initially 95% on RA.  He then desaturated to 76% while asleep but recovered to 100 after 4 L.  BMP, CBC, troponin, BNP, CXR and twelve-lead EKG without acute finding.  CTA chest negative for central PE but poor quality.  D-dimer slightly elevated to 0.71.  Patient was started on IV heparin.  Admission accepted by overnight admitter and transferred to [facility] hospital for further work-up for VTE.  He was also started on Zosyn and vancomycin for possible cellulitis.  ROS All review of system negative except for pertinent positives and negatives as history of present illness above.  PMH As in HPI  [facility] Past Surgical History:  Procedure  Laterality Date  . HERNIA REPAIR     Fam HX Denies family history of heart disease or blood clots  Social Hx As in HPI  Allergy No Known Allergies Home Meds Prior to Admission medications   Not on File    Physical Exam: Vitals:   02/04/20 0700 02/04/20 0820 02/04/20 0920 02/04/20 1044  BP:  (!) 117/54 (!) 112/59 (!) 142/86  Pulse: 80 67 64 83  Resp: (!) 26 14 13 20   Temp:    98.7 F (37.1 C)  TempSrc:    Oral  SpO2: 100% 100% 96% 95%  Weight:      Height:        GENERAL: No acute distress.  Appears well.  HEENT: MMM.  Vision and hearing grossly intact.  NECK: Supple.  No apparent JVD.  RESP: 95% on RA.  No IWOB. Good air movement bilaterally. CVS:  RRR. Heart sounds normal.  ABD/GI/GU: Bowel sounds present. Soft. Non tender.  MSK/EXT:  Moves extremities.  Nonpitting edema bilaterally, Lt>Rt SKIN: no apparent skin lesion or wound but induration over posterior aspect of LLE.  No erythema or increased warmth to touch. NEURO: Awake, alert and oriented appropriately.  No gross deficit.  PSYCH: Calm. Normal affect.   Personally Reviewed Radiological Exams CT Angio Chest PE W and/or Wo Contrast  Result Date: 02/04/2020 CLINICAL DATA:  Dyspnea EXAM: CT ANGIOGRAPHY CHEST WITH CONTRAST TECHNIQUE: Multidetector CT imaging of the chest was performed using the standard protocol during bolus administration of  intravenous contrast. Multiplanar CT image reconstructions and MIPs were obtained to evaluate the vascular anatomy. CONTRAST:  OMNIPAQUE IOHEXOL 350 MG/ML SOLN COMPARISON:  None. FINDINGS: Cardiovascular: There is suboptimal opacification of the central pulmonary arteries. As such, the examination is only capable of excluding intraluminal filling defect within the main and central right and left pulmonary arteries, of which there is none. The lobar, segmental, and subsegmental pulmonary arteries are not opacified adequately to reliably exclude the presence of small pulmonary  emboli. Central pulmonary arterial caliber is within normal limits. Cardiac size is within normal limits. No pericardial effusion. No significant coronary artery calcification. Thoracic aorta is unremarkable. Mediastinum/Nodes: No pathologic thoracic adenopathy. Thyroid unremarkable. Esophagus unremarkable. Lungs/Pleura: Lungs are clear. No pneumothorax or pleural effusion. Central airways are widely patent. Upper Abdomen: No acute abnormality. Musculoskeletal: No acute bone abnormality. Review of the MIP images confirms the above findings. IMPRESSION: 1. Suboptimal opacification of the central pulmonary arteries. As such, the examination is only capable of excluding intraluminal filling defect within the main and central right and left pulmonary arteries, of which there is none. The lobar, segmental, and subsegmental pulmonary arteries are not adequately opacified to reliably exclude the presence of small pulmonary emboli. 2. Otherwise, unremarkable CT angiogram of the chest. Electronically Signed   By: Helyn Numbers MD   On: 02/04/2020 03:54   DG Chest Portable 1 View  Result Date: 02/03/2020 CLINICAL DATA:  Shortness of breath and lower extremity swelling for 2 weeks. EXAM: PORTABLE CHEST 1 VIEW COMPARISON:  None. FINDINGS: Lungs clear. Heart size normal. No pneumothorax or pleural fluid. No acute or focal bony abnormality. IMPRESSION: No acute disease. Electronically Signed   By: Drusilla Kanner M.D.   On: 02/03/2020 16:14     Personally Reviewed Labs: CBC: Recent Labs  Lab 02/03/20 1716  WBC 7.0  HGB 14.2  HCT 44.5  MCV 87.4  PLT 256   Basic Metabolic Panel: Recent Labs  Lab 02/03/20 1716  NA 138  K 3.7  CL 100  CO2 27  GLUCOSE 101*  BUN 13  CREATININE 0.82  CALCIUM 8.8*   GFR: Estimated Creatinine Clearance: 200.4 mL/min (by C-G formula based on SCr of 0.82 mg/dL). Liver Function Tests: No results for input(s): AST, ALT, ALKPHOS, BILITOT, PROT, ALBUMIN in the last 168  hours. No results for input(s): LIPASE, AMYLASE in the last 168 hours. No results for input(s): AMMONIA in the last 168 hours. Coagulation Profile: No results for input(s): INR, PROTIME in the last 168 hours. Cardiac Enzymes: No results for input(s): CKTOTAL, CKMB, CKMBINDEX, TROPONINI in the last 168 hours. BNP (last 3 results) No results for input(s): PROBNP in the last 8760 hours. HbA1C: No results for input(s): HGBA1C in the last 72 hours. CBG: No results for input(s): GLUCAP in the last 168 hours. Lipid Profile: No results for input(s): CHOL, HDL, LDLCALC, TRIG, CHOLHDL, LDLDIRECT in the last 72 hours. Thyroid Function Tests: No results for input(s): TSH, T4TOTAL, FREET4, T3FREE, THYROIDAB in the last 72 hours. Anemia Panel: No results for input(s): VITAMINB12, FOLATE, FERRITIN, TIBC, IRON, RETICCTPCT in the last 72 hours. Urine analysis:    Component Value Date/Time   COLORURINE YELLOW 06/17/2007 1101   APPEARANCEUR CLEAR 06/17/2007 1101   LABSPEC 1.024 06/17/2007 1101   PHURINE 6.0 06/17/2007 1101   GLUCOSEU NEGATIVE 06/17/2007 1101   HGBUR NEGATIVE 06/17/2007 1101   BILIRUBINUR NEGATIVE 06/17/2007 1101   KETONESUR NEGATIVE 06/17/2007 1101   PROTEINUR NEGATIVE 06/17/2007 1101   UROBILINOGEN 0.2  06/17/2007 1101   NITRITE NEGATIVE 06/17/2007 1101   LEUKOCYTESUR TRACE (A) 06/17/2007 1101    Sepsis Labs:  None.  Personally Reviewed EKG:  Twelve-lead EKG normal sinus rhythm  Assessment/Plan Bilateral lower extremity edema, Left >Right-seems more of venous insufficiency than cellulitis on exam.  Given associated dyspnea, cannot exclude CHF although no orthopnea or PND. -Lower extremity Doppler to exclude DVT -Continue IV heparin pending lower extremity DVT Doppler -Obtain echocardiogram to exclude CHF -Encourage leg elevation -Trial of IV Lasix -Hold antibiotics.  Does not seem cellulitic  Acute respiratory failure with hypoxia-suspect undiagnosed OSA/OHS.    -Nightly CPAP -Would benefit from outpatient sleep study  Dyspnea-this could be due to morbid obesity/OSA/OHS.  CTA chest and CXR without acute finding.  COVID-19 PCR negative.  EKG, troponin and BNP within normal.  BNP could be falsely low given obesity. -Trial of IV Lasix as above -Check echocardiogram -Nightly CPAP  Elevated blood pressure: Could have undiagnosed HTN. -IV Lasix as above  Elevated D-dimer: D-dimer 0.71.  CTA chest negative for central PE -Continue IV heparin -Follow lower extremity DVT Doppler  Morbid obesity: Body mass index is 67.92 kg/m. -Encourage lifestyle change to lose weight -Check hemoglobin A1c, TSH and morning lipid panel  Snoring/at risk for sleep apnea -Nightly CPAP as above -Need outpatient sleep study   DVT prophylaxis: On IV heparin  Code Status: Full code Family Communication: Patient declined.  Disposition Plan: Admitted to telemetry Consults called: None Admission status: Observation   Almon Hercules MD [facility]  If 7PM-7AM, please contact night-coverage [email or web address]  02/04/2020, 3:58 PM

## 2020-02-04 NOTE — Plan of Care (Signed)

## 2020-02-04 NOTE — ED Notes (Signed)
Patient placed on 4L [place] due to decrease in oxygen saturations. Patient has sleep apnea. Patient repositioned and placed on 4L [place]. Patient oxygen saturations increased to 94% on 4L [place]. Will continue to monitor and make changes as necessary. Patient tolerating well.

## 2020-02-04 NOTE — ED Notes (Signed)
Given po fluids 

## 2020-02-05 ENCOUNTER — Observation Stay (HOSPITAL_BASED_OUTPATIENT_CLINIC_OR_DEPARTMENT_OTHER): Payer: Medicaid Other

## 2020-02-05 DIAGNOSIS — R030 Elevated blood-pressure reading, without diagnosis of hypertension: Secondary | ICD-10-CM | POA: Diagnosis not present

## 2020-02-05 DIAGNOSIS — R7989 Other specified abnormal findings of blood chemistry: Secondary | ICD-10-CM | POA: Diagnosis present

## 2020-02-05 DIAGNOSIS — J9621 Acute and chronic respiratory failure with hypoxia: Secondary | ICD-10-CM | POA: Diagnosis not present

## 2020-02-05 DIAGNOSIS — R0600 Dyspnea, unspecified: Secondary | ICD-10-CM | POA: Diagnosis not present

## 2020-02-05 DIAGNOSIS — R0602 Shortness of breath: Secondary | ICD-10-CM

## 2020-02-05 DIAGNOSIS — R600 Localized edema: Secondary | ICD-10-CM | POA: Diagnosis present

## 2020-02-05 LAB — ECHOCARDIOGRAM COMPLETE
Area-P 1/2: 3.85 cm2
Height: 67 in
S' Lateral: 3.3 cm
Weight: 6803.2 oz

## 2020-02-05 LAB — CBC
HCT: 46 % (ref 39.0–52.0)
Hemoglobin: 14.5 g/dL (ref 13.0–17.0)
MCH: 27.9 pg (ref 26.0–34.0)
MCHC: 31.5 g/dL (ref 30.0–36.0)
MCV: 88.6 fL (ref 80.0–100.0)
Platelets: 267 10*3/uL (ref 150–400)
RBC: 5.19 MIL/uL (ref 4.22–5.81)
RDW: 14 % (ref 11.5–15.5)
WBC: 6.5 10*3/uL (ref 4.0–10.5)
nRBC: 0 % (ref 0.0–0.2)

## 2020-02-05 LAB — BASIC METABOLIC PANEL
Anion gap: 13 (ref 5–15)
BUN: 15 mg/dL (ref 6–20)
CO2: 26 mmol/L (ref 22–32)
Calcium: 9.4 mg/dL (ref 8.9–10.3)
Chloride: 103 mmol/L (ref 98–111)
Creatinine, Ser: 0.88 mg/dL (ref 0.61–1.24)
GFR calc Af Amer: >60 mL/min (ref >60)
GFR calc non Af Amer: >60 mL/min (ref >60)
Glucose, Bld: 113 mg/dL — ABNORMAL HIGH (ref 70–99)
Potassium: 4 mmol/L (ref 3.5–5.1)
Sodium: 142 mmol/L (ref 135–145)

## 2020-02-05 LAB — HEPARIN LEVEL (UNFRACTIONATED): Heparin Unfractionated: 0.66 IU/mL (ref 0.30–0.70)

## 2020-02-05 LAB — MAGNESIUM: Magnesium: 2.2 mg/dL (ref 1.7–2.4)

## 2020-02-05 MED ORDER — POTASSIUM CHLORIDE CRYS ER 20 MEQ PO TBCR
40.0000 meq | EXTENDED_RELEASE_TABLET | Freq: Once | ORAL | Status: AC
  Administered 2020-02-05: 40 meq via ORAL
  Filled 2020-02-05: qty 2

## 2020-02-05 MED ORDER — FUROSEMIDE 40 MG PO TABS: 40.0000 mg | ORAL_TABLET | Freq: Every day | ORAL | 0 refills | Status: DC

## 2020-02-05 MED ORDER — POTASSIUM CHLORIDE CRYS ER 20 MEQ PO TBCR: 20.0000 meq | EXTENDED_RELEASE_TABLET | Freq: Every day | ORAL | 0 refills | Status: AC

## 2020-02-05 MED ORDER — FUROSEMIDE 10 MG/ML IJ SOLN
40.0000 mg | Freq: Once | INTRAMUSCULAR | Status: AC
  Administered 2020-02-05: 40 mg via INTRAVENOUS
  Filled 2020-02-05: qty 4

## 2020-02-05 NOTE — Discharge Summary (Signed)
Physician Discharge Summary  Khaled Herda ZOX:[id number] DOB: February 15, 1981 DOA: 02/03/2020  PCP: Patient, No Pcp Per  Admit date: 02/03/2020 Discharge date: 02/05/2020  Time spent: 55 minutes  Recommendations for Outpatient Follow-up:  1. Follow-up with [facility] 02/28/2020.  On follow-up patient will need reassessment of lower extremity edema, comprehensive metabolic profile to follow-up on electrolytes, renal function, liver enzymes, magnesium level.  Patient also need referral for sleep study to rule out sleep apnea.  Blood pressure also need to be reassessed on follow-up visit.   Discharge Diagnoses:  Principal Problem:   Bilateral lower extremity edema Active Problems:   Elevated blood pressure reading   Dyspnea   Acute on chronic respiratory failure with hypoxia (HCC)   Obesity, Class III, BMI 40-49.9 (morbid obesity) (HCC)   Elevated d-dimer   Discharge Condition: Stable and improved  Diet recommendation: Heart healthy  Filed Weights   02/04/20 0400 02/04/20 1658 02/05/20 0500  Weight: (!) 196.7 kg (!) 195.1 kg (!) 192.9 kg    History of present illness:  HPI per Dr. Shirline Frees is a 39 y.o. male with history of morbid obesity, edema and snoring presenting to [facility] ED with bilateral lower extremity swelling.  Patient reported bilateral lower extremity swelling for 2 - 3 weeks that had gotten worse.  Swelling was more pronounced in the right lower extremity.  Pains and tender to touch.  Denied redness.  He is mostly on his feet or sitting at work.  He also reported increased shortness of breath over the last 1 week.  He denied orthopnea or PND.  Denied history of CHF.  He says he had bilateral lower extremity swelling in the past that improved with leg elevation.  He denied prolonged immobility, calf pain or history of blood clot.  He denies fever, chills, cough, runny nose, sore throat, chest pain, nausea, vomiting, abdominal pain,  diarrhea or UTI symptoms.  Patient denied known COVID-19 exposure.  He is unvaccinated against COVID-19.  Patient denied family history of heart disease or blood clots.  Admits to smoking about 4 to 5 cigarettes a year.  Reports vaping.  Denies drinking alcohol recreational drug use.  In ED, slightly elevated BP.  RR 24> 20.  Initially 95% on RA.  He then desaturated to 76% while asleep but recovered to 100 after 4 L.  BMP, CBC, troponin, BNP, CXR and twelve-lead EKG without acute finding.  CTA chest negative for central PE but poor quality.  D-dimer slightly elevated to 0.71.  Patient was started on IV heparin.  Admission accepted by overnight admitter and transferred to [facility] hospital for further work-up for VTE.  He was also started on Zosyn and vancomycin for possible cellulitis.  ROS All review of system negative except for pertinent positives and negatives as history of present illness above.  Hospital Course:  1 bilateral lower extremity edema L > R Questionable etiology.  Likely secondary to venous insufficiency versus secondary to obstructive sleep apnea.  Patient was admitted with initial concern for cellulitis however per admitting physician and per my exam unlikely that patient had a cellulitis.  Antibiotics discontinued.  CT angiogram chest which was obtained was negative for PE.  Chest x-ray no acute findings.  Patient initially placed empirically on IV heparin due to concerns for lower extremity DVT.  Lower extremity Dopplers were done which were negative for DVT.  TSH within normal limits.  2D echo was obtained with a normal EF 65 to 70%,  no wall motion abnormalities, normal right ventricular systolic function, trivial mitral valve regurgitation, right atrial pressure of 8 mmHg.  Patient given Lasix 40 mg IV x1 on day of admission with a urine output of 2.5 L with clinical improvement.  Patient noted to be satting 95% on room air.  Patient with no renal dysfunction.  Urinalysis was not  done on admission.  Patient was placed on CPAP nightly.  Patient was given another dose of Lasix 40 mg IV x1 on day of discharge and will be discharged home on 7 days of Lasix 40 mg daily.  Outpatient follow-up with PCP.  On follow-up patient will likely need referral for evaluation for obstructive sleep apnea.  2.  Acute respiratory failure with hypoxia/dyspnea Felt secondary to undiagnosed OSA/OHS.  Patient was placed on CPAP nightly.  Patient received a dose of IV Lasix as noted above.  2D echo obtained with a normal EF of 65 to 70%, no wall motion abnormalities.  CT angiogram chest done was negative for PE.  Lower extremity Dopplers were negative for DVT.  Patient diuresed with IV Lasix.  Patient improved clinically.  Patient was satting 95% on room air by day of discharge and was ambulating with sats greater than 92% without any significant shortness of breath.  Patient be discharged home on 1 week of oral Lasix.  Outpatient follow-up with PCP.  3.  Elevated blood pressure Patient was on diuretics during the hospitalization.  Outpatient follow-up with PCP.  4.  Elevated D-dimer D-dimer noted to be at 0.71.  CT angiogram chest was negative for any central PE.  Lower extremity Dopplers were negative for DVT.  Patient initially placed on IV heparin while lower extremity Dopplers were pending.  IV heparin discontinued with negative Dopplers.  5.  Morbid obesity Lifestyle change encouraged the patient to lose weight.  6.  Snoring/increased risk for sleep apnea Patient was placed on CPAP nightly during the hospitalization.  Will need outpatient referral for sleep study.   Procedures:  2D echo 02/05/2020  Lower extremity Dopplers 02/04/2020  CT angiogram chest 02/04/2020  Chest x-ray 02/03/2020  Consultations:  None  Discharge Exam: Vitals:   02/05/20 0555 02/05/20 1316  BP: 106/77 (!) 146/93  Pulse: 93 88  Resp: (!) 22 17  Temp: 98.5 F (36.9 C) 98.5 F (36.9 C)  SpO2: 94% 95%     General:  NAD Cardiovascular: RRR.  Distant heart sounds.  1-2+ bilateral lower extremity edema left > right Respiratory: Clear to auscultation bilaterally.  No wheezes, no crackles, no rhonchi.  Discharge Instructions   Discharge Instructions    Diet - low sodium heart healthy   Complete by: As directed    1500 cc/day fluid restriction   Increase activity slowly   Complete by: As directed      Allergies as of 02/05/2020   No Known Allergies     Medication List    TAKE these medications   furosemide 40 MG tablet Commonly known as: Lasix Take 1 tablet (40 mg total) by mouth daily for 7 days. Start taking on: February 06, 2020   potassium chloride SA 20 MEQ tablet Commonly known as: KLOR-CON Take 1 tablet (20 mEq total) by mouth daily for 7 days. Start taking on: February 06, 2020      No Known Allergies  Follow-up Information    [facility] Follow up.   Why: you have an appointment on 02/28/20 at 1110 am with Dr Sharon SellerMcClung to  establish primary care services.  please speak with doctor about scheduling an outpatient sleep study.   Contact information: [address] [place] [address] [phone]               The results of significant diagnostics from this hospitalization (including imaging, microbiology, ancillary and laboratory) are listed below for reference.    Significant Diagnostic Studies: CT Angio Chest PE W and/or Wo Contrast  Result Date: 02/04/2020 CLINICAL DATA:  Dyspnea EXAM: CT ANGIOGRAPHY CHEST WITH CONTRAST TECHNIQUE: Multidetector CT imaging of the chest was performed using the standard protocol during bolus administration of intravenous contrast. Multiplanar CT image reconstructions and MIPs were obtained to evaluate the vascular anatomy. CONTRAST:  OMNIPAQUE IOHEXOL 350 MG/ML SOLN COMPARISON:  None. FINDINGS: Cardiovascular: There is suboptimal opacification of the central pulmonary  arteries. As such, the examination is only capable of excluding intraluminal filling defect within the main and central right and left pulmonary arteries, of which there is none. The lobar, segmental, and subsegmental pulmonary arteries are not opacified adequately to reliably exclude the presence of small pulmonary emboli. Central pulmonary arterial caliber is within normal limits. Cardiac size is within normal limits. No pericardial effusion. No significant coronary artery calcification. Thoracic aorta is unremarkable. Mediastinum/Nodes: No pathologic thoracic adenopathy. Thyroid unremarkable. Esophagus unremarkable. Lungs/Pleura: Lungs are clear. No pneumothorax or pleural effusion. Central airways are widely patent. Upper Abdomen: No acute abnormality. Musculoskeletal: No acute bone abnormality. Review of the MIP images confirms the above findings. IMPRESSION: 1. Suboptimal opacification of the central pulmonary arteries. As such, the examination is only capable of excluding intraluminal filling defect within the main and central right and left pulmonary arteries, of which there is none. The lobar, segmental, and subsegmental pulmonary arteries are not adequately opacified to reliably exclude the presence of small pulmonary emboli. 2. Otherwise, unremarkable CT angiogram of the chest. Electronically Signed   By: Helyn Numbers MD   On: 02/04/2020 03:54   DG Chest Portable 1 View  Result Date: 02/03/2020 CLINICAL DATA:  Shortness of breath and lower extremity swelling for 2 weeks. EXAM: PORTABLE CHEST 1 VIEW COMPARISON:  None. FINDINGS: Lungs clear. Heart size normal. No pneumothorax or pleural fluid. No acute or focal bony abnormality. IMPRESSION: No acute disease. Electronically Signed   By: Drusilla Kanner M.D.   On: 02/03/2020 16:14   ECHOCARDIOGRAM COMPLETE  Result Date: 02/05/2020    ECHOCARDIOGRAM REPORT   Patient Name:   ISON WICHMANN Date of Exam: 02/05/2020 Medical Rec #:  [id number]     Height:        67.0 in Accession #:    [id number]    Weight:       425.2 lb Date of Birth:  06/15/1981      BSA:          2.786 m Patient Age:    39 years      BP:           106/77 mmHg Patient Gender: M             HR:           79 bpm. Exam Location:  Inpatient Procedure: 2D Echo, Color Doppler and Cardiac Doppler Indications:    R06.9 DOE  History:        Patient has no prior history of Echocardiogram examinations.                 Risk Factors:Sleep Apnea.  Sonographer:  [facility] Senior RDCS Referring Phys: [id number] Almon Hercules  Sonographer Comments: Technically difficult due to patient body habitus. IMPRESSIONS  1. Left ventricular ejection fraction, by estimation, is 65 to 70%. The left ventricle has normal function. The left ventricle has no regional wall motion abnormalities. Left ventricular diastolic parameters were normal.  2. Right ventricular systolic function is normal. The right ventricular size is normal. There is normal pulmonary artery systolic pressure. The estimated right ventricular systolic pressure is 34.4 mmHg.  3. The mitral valve is abnormal. Trivial mitral valve regurgitation.  4. The aortic valve is tricuspid. Aortic valve regurgitation is not visualized.  5. The inferior vena cava is dilated in size with >50% respiratory variability, suggesting right atrial pressure of 8 mmHg. FINDINGS  Left Ventricle: Left ventricular ejection fraction, by estimation, is 65 to 70%. The left ventricle has normal function. The left ventricle has no regional wall motion abnormalities. The left ventricular internal cavity size was normal in size. There is  no left ventricular hypertrophy. Left ventricular diastolic parameters were normal. Right Ventricle: The right ventricular size is normal. No increase in right ventricular wall thickness. Right ventricular systolic function is normal. There is normal pulmonary artery systolic pressure. The tricuspid regurgitant velocity is 2.57 m/s, and  with an assumed right atrial  pressure of 8 mmHg, the estimated right ventricular systolic pressure is 34.4 mmHg. Left Atrium: Left atrial size was normal in size. Right Atrium: Right atrial size was normal in size. Pericardium: There is no evidence of pericardial effusion. Mitral Valve: The mitral valve is abnormal. There is mild thickening of the mitral valve leaflet(s). Trivial mitral valve regurgitation. Tricuspid Valve: The tricuspid valve is grossly normal. Tricuspid valve regurgitation is trivial. Aortic Valve: The aortic valve is tricuspid. Aortic valve regurgitation is not visualized. Pulmonic Valve: The pulmonic valve was normal in structure. Pulmonic valve regurgitation is not visualized. Aorta: The aortic root and ascending aorta are structurally normal, with no evidence of dilitation. Venous: The inferior vena cava is dilated in size with greater than 50% respiratory variability, suggesting right atrial pressure of 8 mmHg. IAS/Shunts: No atrial level shunt detected by color flow Doppler.  LEFT VENTRICLE PLAX 2D LVIDd:         5.40 cm  Diastology LVIDs:         3.30 cm  LV e' lateral:   12.20 cm/s LV PW:         1.00 cm  LV E/e' lateral: 6.9 LV IVS:        1.00 cm  LV e' medial:    8.49 cm/s LVOT diam:     2.10 cm  LV E/e' medial:  9.8 LV SV:         72 LV SV Index:   26 LVOT Area:     3.46 cm  RIGHT VENTRICLE RV S prime:     14.30 cm/s TAPSE (M-mode): 2.9 cm LEFT ATRIUM           Index       RIGHT ATRIUM           Index LA diam:      4.50 cm 1.61 cm/m  RA Area:     18.50 cm LA Vol (A4C): 33.4 ml 11.99 ml/m RA Volume:   52.90 ml  18.98 ml/m  AORTIC VALVE LVOT Vmax:   107.00 cm/s LVOT Vmean:  82.700 cm/s LVOT VTI:    0.209 m  AORTA Ao Root diam: 2.90 cm Ao Asc diam:  3.30  cm MITRAL VALVE               TRICUSPID VALVE MV Area (PHT): 3.85 cm    TR Peak grad:   26.4 mmHg MV Decel Time: 197 msec    TR Vmax:        257.00 cm/s MV E velocity: 83.60 cm/s MV A velocity: 52.30 cm/s  SHUNTS MV E/A ratio:  1.60        Systemic VTI:  0.21 m                             Systemic Diam: 2.10 cm Zoila Shutter MD Electronically signed by Zoila Shutter MD Signature Date/Time: 02/05/2020/9:49:00 AM    Final    VAS [place] LOWER EXTREMITY VENOUS (DVT)  Result Date: 02/05/2020  Lower Venous DVTStudy Indications: Pulmonary embolism.  Limitations: Body habitus and depth of vessels. Comparison Study: No prior study Performing Technologist: Gertie Fey MHA, RDMS, RVT, RDCS  Examination Guidelines: A complete evaluation includes B-mode imaging, spectral Doppler, color Doppler, and power Doppler as needed of all accessible portions of each vessel. Bilateral testing is considered an integral part of a complete examination. Limited examinations for reoccurring indications may be performed as noted. The reflux portion of the exam is performed with the patient in reverse Trendelenburg.  +---------+--------------------+---------+-----------+----------+--------------+ RIGHT    Compressibility     PhasicitySpontaneityPropertiesThrombus Aging +---------+--------------------+---------+-----------+----------+--------------+ CFV      Unable to compress  Yes      Yes                                          due to technical                                                          limitations                                                      +---------+--------------------+---------+-----------+----------+--------------+ FV Prox  Full                                                             +---------+--------------------+---------+-----------+----------+--------------+ FV Mid   Full                                                             +---------+--------------------+---------+-----------+----------+--------------+ FV DistalFull                                                             +---------+--------------------+---------+-----------+----------+--------------+  POP      Full                Yes      Yes                                  +---------+--------------------+---------+-----------+----------+--------------+ PTV      Unable to compress           Yes                                          due to technical                                                          limitations                                                      +---------+--------------------+---------+-----------+----------+--------------+   Right Technical Findings: Not visualized segments include SFJ, PFV, peroneal veins, limited visualization posterior tibial veins.  +---------+--------------------+---------+-----------+----------+--------------+ LEFT     Compressibility     PhasicitySpontaneityPropertiesThrombus Aging +---------+--------------------+---------+-----------+----------+--------------+ CFV      Unable to compress  Yes      Yes                                          due to technical                                                          limitations                                                      +---------+--------------------+---------+-----------+----------+--------------+ FV Prox  Full                                                             +---------+--------------------+---------+-----------+----------+--------------+ FV Mid   Full                                                             +---------+--------------------+---------+-----------+----------+--------------+ FV DistalUnable to compress           Yes  due to technical                                                          limitations                                                      +---------+--------------------+---------+-----------+----------+--------------+ POP      Full                Yes      Yes                                 +---------+--------------------+---------+-----------+----------+--------------+  PTV      Full                         Yes                                 +---------+--------------------+---------+-----------+----------+--------------+   Left Technical Findings: Not visualized segments include SFJ, PFV, peroneal veins.   Summary: RIGHT: - There is no evidence of deep vein thrombosis in the lower extremity. However, portions of this examination were limited- see technologist comments above.  - No cystic structure found in the popliteal fossa.  LEFT: - There is no evidence of deep vein thrombosis in the lower extremity. However, portions of this examination were limited- see technologist comments above.  - No cystic structure found in the popliteal fossa.  *See table(s) above for measurements and observations. Electronically signed by Waverly Ferrari MD on 02/05/2020 at 5:12:21 AM.    Final     Microbiology: Recent Results (from the past 240 hour(s))  SARS Coronavirus 2 by RT PCR (hospital order, performed in [facility] hospital lab) Nasopharyngeal Nasopharyngeal Swab     Status: None   Collection Time: 02/04/20  2:39 AM   Specimen: Nasopharyngeal Swab  Result Value Ref Range Status   SARS Coronavirus 2 NEGATIVE NEGATIVE Final    Comment: (NOTE) SARS-CoV-2 target nucleic acids are NOT DETECTED.  The SARS-CoV-2 RNA is generally detectable in upper and lower respiratory specimens during the acute phase of infection. The lowest concentration of SARS-CoV-2 viral copies this assay can detect is 250 copies / mL. A negative result does not preclude SARS-CoV-2 infection and should not be used as the sole basis for treatment or other patient management decisions.  A negative result may occur with improper specimen collection / handling, submission of specimen other than nasopharyngeal swab, presence of viral mutation(s) within the areas targeted by this assay, and inadequate number of viral copies (<250 copies / mL). A negative result must be combined with  clinical observations, patient history, and epidemiological information.  Fact Sheet for Patients:   BoilerBrush.com.cy  Fact Sheet for Healthcare Providers: https://pope.com/  This test is not yet approved or  cleared by the [place] FDA and has been authorized for detection and/or diagnosis of SARS-CoV-2 by FDA under an Emergency Use Authorization (EUA).  This EUA will remain in effect (meaning this test  can be used) for the duration of the COVID-19 declaration under Section 564(b)(1) of the Act, 21 U.S.C. section 360bbb-3(b)(1), unless the authorization is terminated or revoked sooner.  Performed at [facility], [address] [place], [place] [address]   Blood culture (routine x 2)     Status: None (Preliminary result)   Collection Time: 02/04/20  7:00 AM   Specimen: Left Antecubital; Blood  Result Value Ref Range Status   Specimen Description   Final    LEFT ANTECUBITAL Performed at [facility], [address] [place], [place] [address]    Special Requests   Final    BOTTLES DRAWN AEROBIC AND ANAEROBIC Blood Culture adequate volume Performed at [facility], [address] [place], [place] [address]    Culture   Final    NO GROWTH < 24 HOURS Performed at [facility], [address] [place], [place] [address]    Report Status PENDING  Incomplete  Blood culture (routine x 2)     Status: None (Preliminary result)   Collection Time: 02/04/20  7:08 AM   Specimen: Right Antecubital; Blood  Result Value Ref Range Status   Specimen Description   Final    RIGHT ANTECUBITAL Performed at [facility], [address] [place], [place] [address]    Special Requests   Final    BOTTLES DRAWN AEROBIC AND ANAEROBIC Blood Culture adequate volume Performed at [facility], [address] [place], [place] [address]    Culture   Final    NO GROWTH < 24  HOURS Performed at [facility], [address], [place], [place] [address]    Report Status PENDING  Incomplete     Labs: Basic Metabolic Panel: Recent Labs  Lab 02/03/20 1716 02/05/20 0547  NA 138 142  K 3.7 4.0  CL 100 103  CO2 27 26  GLUCOSE 101* 113*  BUN 13 15  CREATININE 0.82 0.88  CALCIUM 8.8* 9.4  MG  --  2.2   Liver Function Tests: No results for input(s): AST, ALT, ALKPHOS, BILITOT, PROT, ALBUMIN in the last 168 hours. No results for input(s): LIPASE, AMYLASE in the last 168 hours. No results for input(s): AMMONIA in the last 168 hours. CBC: Recent Labs  Lab 02/03/20 1716 02/04/20 2114 02/05/20 0547  WBC 7.0 7.3 6.5  HGB 14.2 15.1 14.5  HCT 44.5 46.5 46.0  MCV 87.4 87.2 88.6  PLT 256 280 267   Cardiac Enzymes: No results for input(s): CKTOTAL, CKMB, CKMBINDEX, TROPONINI in the last 168 hours. BNP: BNP (last 3 results) Recent Labs    02/03/20 1716  BNP 38.1    ProBNP (last 3 results) No results for input(s): PROBNP in the last 8760 hours.  CBG: No results for input(s): GLUCAP in the last 168 hours.     Signed:  Ramiro Harvest MD.  [facility] 02/05/2020, 5:36 PM

## 2020-02-05 NOTE — Progress Notes (Signed)
Echocardiogram 2D Echocardiogram has been performed.  Warren Lacy Sigismund Cross 02/05/2020, 8:27 AM

## 2020-02-05 NOTE — Evaluation (Signed)
Physical Therapy Evaluation-1x Patient Details Name: Stanley Ramirez MRN: [id number] DOB: 02-11-1981 Today's Date: 02/05/2020   History of Present Illness  39 yo male admitted with bil LE edema, acute resp failure, L LE cellulitis. Hx of morbid obesity, sleep apnea  Clinical Impression  On eval, pt was Mod Ind-Ind with mobility. He walked ~175 feet around the unit. O2 92% on RA during session. No PT needs. 1x eval.     Follow Up Recommendations No PT follow up    Equipment Recommendations  None recommended by PT    Recommendations for Other Services       Precautions / Restrictions Precautions Precautions: Fall Restrictions Weight Bearing Restrictions: No      Mobility  Bed Mobility Overal bed mobility: Modified Independent                Transfers Overall transfer level: Independent                  Ambulation/Gait Ambulation/Gait assistance: Independent Gait Distance (Feet): 175 Feet Assistive device: None Gait Pattern/deviations: WFL(Within Functional Limits)        Stairs            Wheelchair Mobility    Modified Rankin (Stroke Patients Only)       Balance Overall balance assessment: Independent                                           Pertinent Vitals/Pain Pain Assessment: No/denies pain    Home Living Family/patient expects to be discharged to:: Private residence Living Arrangements: Children   Type of Home: House Home Access: Stairs to enter Entrance Stairs-Rails: None Secretary/administrator of Steps: 3 Home Layout: One level Home Equipment: None      Prior Function Level of Independence: Independent               Hand Dominance        Extremity/Trunk Assessment   Upper Extremity Assessment Upper Extremity Assessment: Overall WFL for tasks assessed    Lower Extremity Assessment Lower Extremity Assessment: Overall WFL for tasks assessed    Cervical / Trunk Assessment Cervical /  Trunk Assessment: Normal  Communication   Communication: No difficulties  Cognition Arousal/Alertness: Awake/alert Behavior During Therapy: WFL for tasks assessed/performed Overall Cognitive Status: Within Functional Limits for tasks assessed                                        General Comments      Exercises     Assessment/Plan    PT Assessment Patent does not need any further PT services  PT Problem List         PT Treatment Interventions      PT Goals (Current goals can be found in the Care Plan section)  Acute Rehab PT Goals Patient Stated Goal: home PT Goal Formulation: All assessment and education complete, DC therapy    Frequency     Barriers to discharge        Co-evaluation               AM-PAC PT "6 Clicks" Mobility  Outcome Measure Help needed turning from your back to your side while in a flat bed without using bedrails?: None Help needed moving from lying on your  back to sitting on the side of a flat bed without using bedrails?: None Help needed moving to and from a bed to a chair (including a wheelchair)?: None Help needed standing up from a chair using your arms (e.g., wheelchair or bedside chair)?: None Help needed to walk in hospital room?: None Help needed climbing 3-5 steps with a railing? : None 6 Click Score: 24    End of Session   Activity Tolerance: Patient tolerated treatment well Patient left: in chair;with call bell/phone within reach (window seat)        Time: 0814-4818 PT Time Calculation (min) (ACUTE ONLY): 20 min   Charges:   PT Evaluation $PT Eval Low Complexity: 1 Low             Faye Ramsay, PT Acute Rehabilitation  Office: [phone] Pager: [phone]

## 2020-02-05 NOTE — TOC Initial Note (Signed)
Transition of Care [facility]) - Initial/Assessment Note    Patient Details  Name: Stanley Ramirez MRN: [id number] Date of Birth: [birth year]  Transition of Care [facility]) CM/SW Contact:    Armanda Heritage, RN Phone Number: 02/05/2020, 11:57 AM  Clinical Narrative:    CM spoke with patient regarding need for pcp services.  Patient set up with [facility], information placed on AVS.                  Expected Discharge Plan: Home/Self Care Barriers to Discharge: Continued Medical Work up   Patient Goals and CMS Choice Patient states their goals for this hospitalization and ongoing recovery are:: to go home      Expected Discharge Plan and Services Expected Discharge Plan: Home/Self Care   Discharge Planning Services: CM Consult   Living arrangements for the past 2 months: Apartment                 DME Arranged: N/A DME Agency: NA       HH Arranged: NA HH Agency: NA        Prior Living Arrangements/Services Living arrangements for the past 2 months: Apartment   Patient language and need for interpreter reviewed:: Yes Do you feel safe going back to the place where you live?: Yes      Need for Family Participation in Patient Care: Yes (Comment) Care giver support system in place?: Yes (comment)   Criminal Activity/Legal Involvement Pertinent to Current Situation/Hospitalization: No - Comment as needed  Activities of Daily Living Home Assistive Devices/Equipment: None ADL Screening (condition at time of admission) Patient's cognitive ability adequate to safely complete daily activities?: Yes Is the patient deaf or have difficulty hearing?: No Does the patient have difficulty seeing, even when wearing glasses/contacts?: No Does the patient have difficulty concentrating, remembering, or making decisions?: No Patient able to express need for assistance with ADLs?: Yes Does the patient have difficulty dressing or bathing?: No Independently performs ADLs?: Yes (appropriate for  developmental age)  Permission Sought/Granted                  Emotional Assessment Appearance:: Appears stated age Attitude/Demeanor/Rapport: Engaged Affect (typically observed): Accepting Orientation: : Oriented to Self, Oriented to Place, Oriented to  Time, Oriented to Situation   Psych Involvement: No (comment)  Admission diagnosis:  Leg swelling [M79.89] Cellulitis of left leg [L03.116] Dyspnea, unspecified type [R06.00] Cellulitis, unspecified cellulitis site [L03.90] Patient Active Problem List   Diagnosis Date Noted  . Cellulitis of left leg 02/04/2020  . BOILS, RECURRENT 06/27/2007   PCP:  Patient, No Pcp Per Pharmacy:   [facility] [address], [place] - [address] [place] [address] Phone: [phone] Fax: [phone]     Social Determinants of Health (SDOH) Interventions    Readmission Risk Interventions No flowsheet data found.

## 2020-02-05 NOTE — Progress Notes (Signed)
Patient Saturations on Room Air at Rest = 95%  Patient Saturations on Room Air while Ambulating = 92%   

## 2020-02-05 NOTE — Plan of Care (Signed)
All discharge instructions were given to Pt. All questions were answered. 

## 2020-02-09 LAB — CULTURE, BLOOD (ROUTINE X 2)
Culture: NO GROWTH
Culture: NO GROWTH
Special Requests: ADEQUATE
Special Requests: ADEQUATE

## 2020-02-22 NOTE — Progress Notes (Signed)
Patient ID: Stanley Ramirez, male   DOB: 10/01/1980, 39 y.o.   MRN: [id number]   Virtual Visit via Telephone Note  I connected with Clydene Laming on 02/28/20 at 11:10 AM EDT by telephone and verified that I am speaking with the correct person using two identifiers.   I discussed the limitations, risks, security and privacy concerns of performing an evaluation and management service by telephone and the availability of in person appointments. I also discussed with the patient that there may be a patient responsible charge related to this service. The patient expressed understanding and agreed to proceed.   PATIENT visit by telephone virtually in the context of Covid-19 pandemic. Patient location:  home My Location:  [facility] office Persons on the call:  Me and the patient    History of Present Illness: After hospitalization 8/21-8/23/2021.  He is doing better overall.  He still needs sleep study set up.  BP OOO is about 130/80-90.  His legs have started swelling again since being out of lasix.  No SOB.  BNP was WNL.    From discharge summary: Recommendations for Outpatient Follow-up:  1. [facility].  On follow-up patient will need reassessment of lower extremity edema, comprehensive metabolic profile to follow-up on electrolytes, renal function, liver enzymes, magnesium level.  Patient also need referral for sleep study to rule out sleep apnea.  Blood pressure also need to be reassessed on follow-up visit.   Discharge Diagnoses:  Principal Problem:   Bilateral lower extremity edema Active Problems:   Elevated blood pressure reading   Dyspnea   Acute on chronic respiratory failure with hypoxia (HCC)   Obesity, Class III, BMI 40-49.9 (morbid obesity) (HCC)   Elevated d-dimer  Hospital Course:  1 bilateral lower extremity edema L > R Questionable etiology.  Likely secondary to venous insufficiency versus secondary to obstructive  sleep apnea.  Patient was admitted with initial concern for cellulitis however per admitting physician and per my exam unlikely that patient had a cellulitis.  Antibiotics discontinued.  CT angiogram chest which was obtained was negative for PE.  Chest x-ray no acute findings.  Patient initially placed empirically on IV heparin due to concerns for lower extremity DVT.  Lower extremity Dopplers were done which were negative for DVT.  TSH within normal limits.  2D echo was obtained with a normal EF 65 to 70%, no wall motion abnormalities, normal right ventricular systolic function, trivial mitral valve regurgitation, right atrial pressure of 8 mmHg.  Patient given Lasix 40 mg IV x1 on day of admission with a urine output of 2.5 L with clinical improvement.  Patient noted to be satting 95% on room air.  Patient with no renal dysfunction.  Urinalysis was not done on admission.  Patient was placed on CPAP nightly.  Patient was given another dose of Lasix 40 mg IV x1 on day of discharge and will be discharged home on 7 days of Lasix 40 mg daily.  Outpatient follow-up with PCP.  On follow-up patient will likely need referral for evaluation for obstructive sleep apnea.  2.  Acute respiratory failure with hypoxia/dyspnea Felt secondary to undiagnosed OSA/OHS.  Patient was placed on CPAP nightly.  Patient received a dose of IV Lasix as noted above.  2D echo obtained with a normal EF of 65 to 70%, no wall motion abnormalities.  CT angiogram chest done was negative for PE.  Lower extremity Dopplers were negative for DVT.  Patient diuresed with  IV Lasix.  Patient improved clinically.  Patient was satting 95% on room air by day of discharge and was ambulating with sats greater than 92% without any significant shortness of breath.  Patient be discharged home on 1 week of oral Lasix.  Outpatient follow-up with PCP.  3.  Elevated blood pressure Patient was on diuretics during the hospitalization.  Outpatient follow-up with  PCP.  4.  Elevated D-dimer D-dimer noted to be at 0.71.  CT angiogram chest was negative for any central PE.  Lower extremity Dopplers were negative for DVT.  Patient initially placed on IV heparin while lower extremity Dopplers were pending.  IV heparin discontinued with negative Dopplers.  5.  Morbid obesity Lifestyle change encouraged the patient to lose weight.  6.  Snoring/increased risk for sleep apnea Patient was placed on CPAP nightly during the hospitalization.  Will need outpatient referral for sleep study.   Procedures:  2D echo 02/05/2020  Lower extremity Dopplers 02/04/2020  CT angiogram chest 02/04/2020  Chest x-ray 02/03/2020    Observations/Objective: NAD.  A&Ox3  Assessment and Plan: 1. Elevated blood pressure reading Check BP 2-3 times weekly and record and bring to next visit.    2. Bilateral lower extremity edema Electrolytes were stable on discharge and on lasix - furosemide (LASIX) 20 MG tablet; Take 1 tablet (20 mg total) by mouth daily.  Dispense: 30 tablet; Refill: 3  3. Sleep apnea, unspecified type - Split night study; Future  4. Hyperglycemia I have had a lengthy discussion and provided education about insulin resistance and the intake of too much sugar/refined carbohydrates.  I have advised the patient to work at a goal of eliminating sugary drinks, candy, desserts, sweets, refined sugars, processed foods, and white carbohydrates.  The patient expresses understanding.   All labs reviewed and essentially normal by hospital discharge and I do not see any need to recheck these today.  We can check them at his next visit  Follow Up Instructions: Assign PCP in about 4 weeks   I discussed the assessment and treatment plan with the patient. The patient was provided an opportunity to ask questions and all were answered. The patient agreed with the plan and demonstrated an understanding of the instructions.   The patient was advised to call back or  seek an in-person evaluation if the symptoms worsen or if the condition fails to improve as anticipated.  I provided 16 minutes of non-face-to-face time during this encounter.   Georgian Co, PA-C

## 2020-02-28 ENCOUNTER — Encounter: Payer: Self-pay | Admitting: Physician Assistant

## 2020-02-28 ENCOUNTER — Ambulatory Visit: Payer: Medicaid Other | Attending: Physician Assistant | Admitting: Physician Assistant

## 2020-02-28 DIAGNOSIS — R600 Localized edema: Secondary | ICD-10-CM | POA: Diagnosis not present

## 2020-02-28 DIAGNOSIS — R030 Elevated blood-pressure reading, without diagnosis of hypertension: Secondary | ICD-10-CM

## 2020-02-28 DIAGNOSIS — R739 Hyperglycemia, unspecified: Secondary | ICD-10-CM

## 2020-02-28 DIAGNOSIS — G4730 Sleep apnea, unspecified: Secondary | ICD-10-CM | POA: Diagnosis not present

## 2020-02-28 MED ORDER — FUROSEMIDE 20 MG PO TABS: 20.0000 mg | ORAL_TABLET | Freq: Every day | ORAL | 3 refills | Status: DC

## 2020-03-11 ENCOUNTER — Telehealth: Payer: Self-pay | Admitting: General Practice

## 2020-03-11 NOTE — Telephone Encounter (Signed)
Patient is calling to request a referral for a sleep study. Please advise CB- [phone]

## 2020-03-11 NOTE — Telephone Encounter (Signed)
Patient is calling to request an antibotic for an abess tooth that the patient has had for 2 months. Patient was offered an appt however the appt was 4 weeks away. Preferred Pharamacy- [facility] [address] [place], [place].

## 2020-03-11 NOTE — Telephone Encounter (Signed)
Pls contact pt and set up an appt to est care w/ a provider, no current PCP on file, per last note from telehealth visit in Sept pt was to schedule appt for this mid-October but did not

## 2020-03-11 NOTE — Telephone Encounter (Signed)
Spoke w/ pt and advised of need to establish care here prior to any meds being prescribed, gave pt info for mobile bus and UC, pt verbalized understanding.

## 2020-03-12 NOTE — Telephone Encounter (Signed)
Gm  the provider place the referral on 9/15  . They sleep study is going to contact patient  .

## 2020-08-09 ENCOUNTER — Other Ambulatory Visit: Payer: Self-pay | Admitting: Physician Assistant

## 2020-08-09 DIAGNOSIS — R600 Localized edema: Secondary | ICD-10-CM

## 2021-11-12 IMAGING — CT CT ANGIO CHEST
2 of 8 series · 18 of 36 positions shown · IV contrast (Omnipaque)
Comparison: None.

CLINICAL DATA: Dyspnea

EXAM:
CT ANGIOGRAPHY CHEST WITH CONTRAST
TECHNIQUE: Multidetector CT imaging of the chest was performed using the
standard protocol during bolus administration of intravenous
contrast. Multiplanar CT image reconstructions and MIPs were
obtained to evaluate the vascular anatomy.
CONTRAST:  100mL OMNIPAQUE IOHEXOL 350 MG/ML SOLN

[Series 6: pe thins · axial · 0.83mm/px · z∈[-286,+6]mm · 17 of 433 slices shown]
[im 22/433  lung]
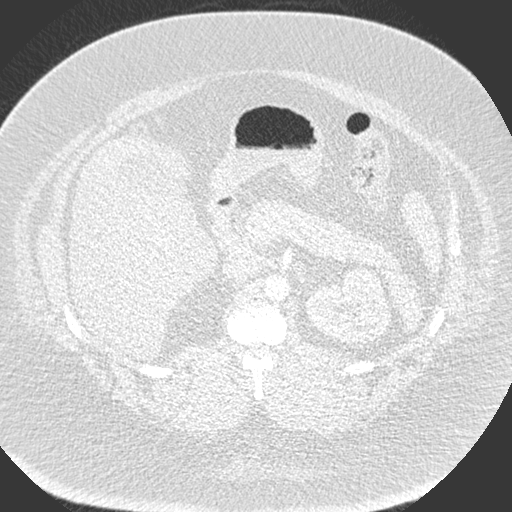
[im 44/433  mediastinal]
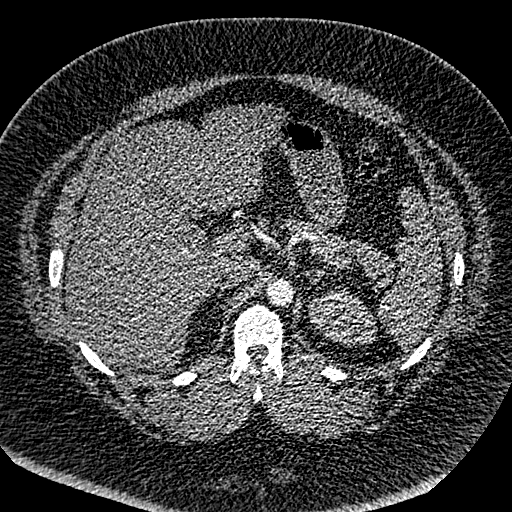
[im 65/433  lung]
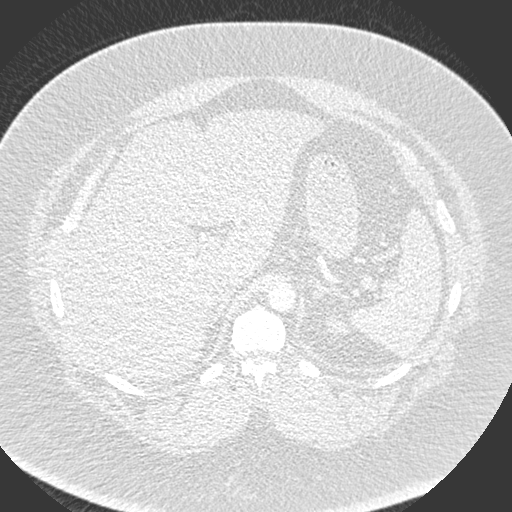
[im 87/433  mediastinal]
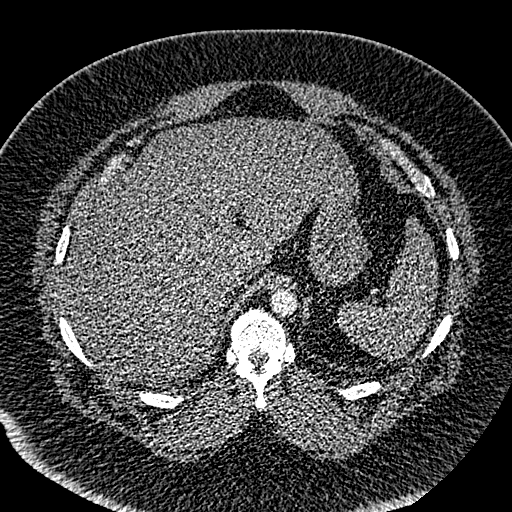
[im 130/433  lung]
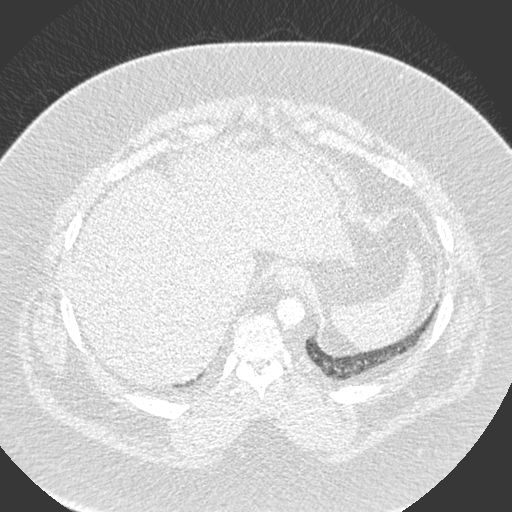
[im 152/433  mediastinal]
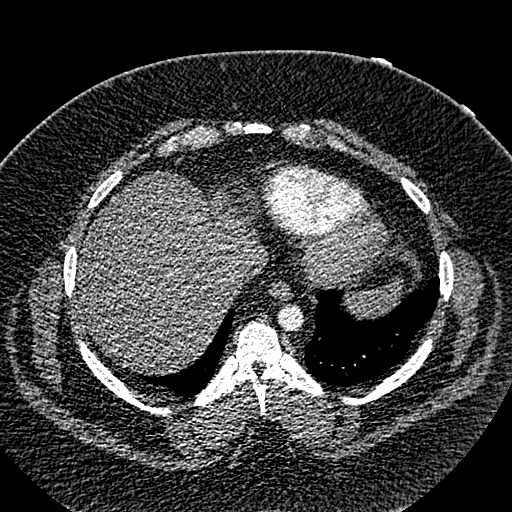
[im 173/433  lung]
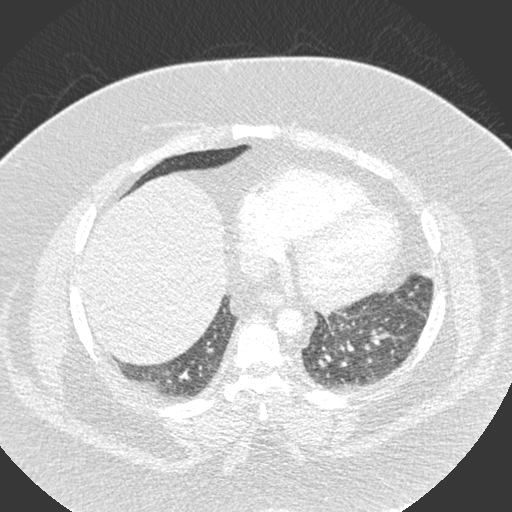
[im 195/433  mediastinal]
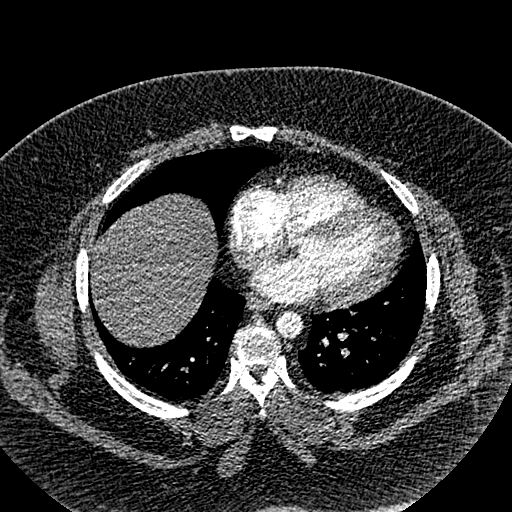
[im 217/433  lung]
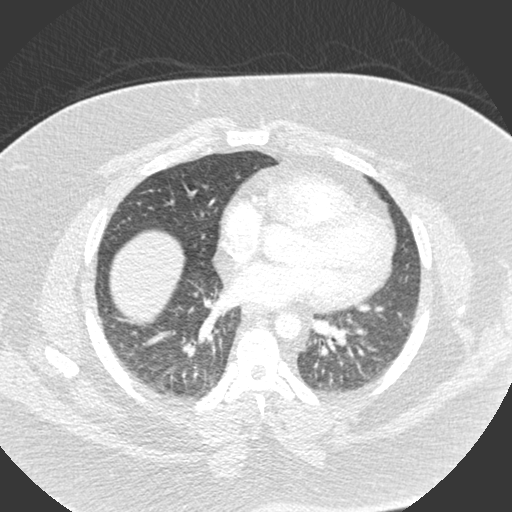
[im 238/433  mediastinal]
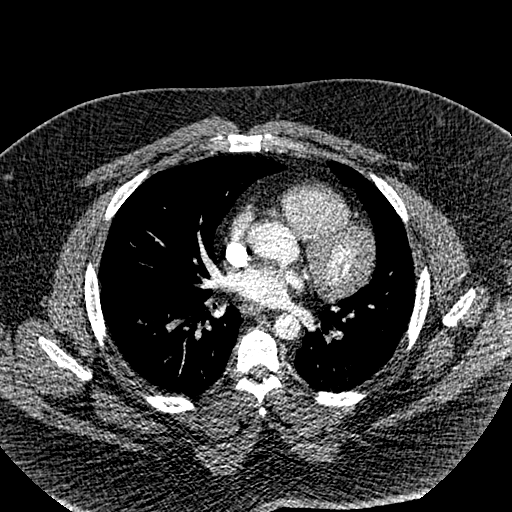
[im 260/433  lung]
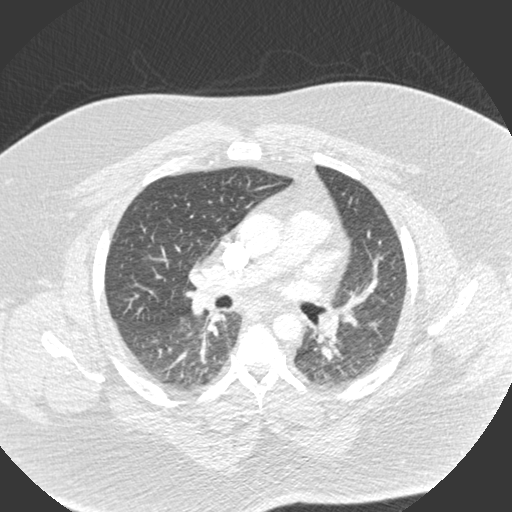
[im 281/433  mediastinal]
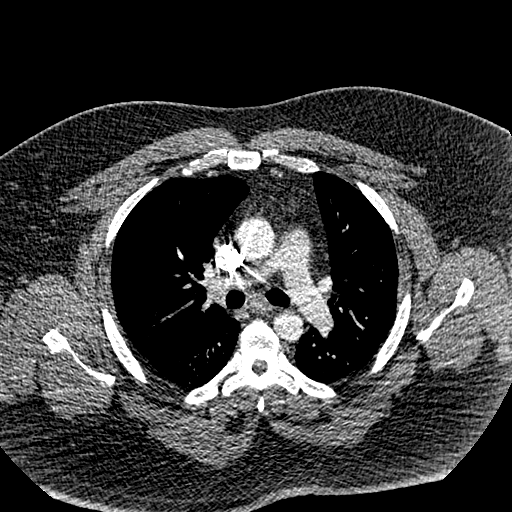
[im 303/433  lung]
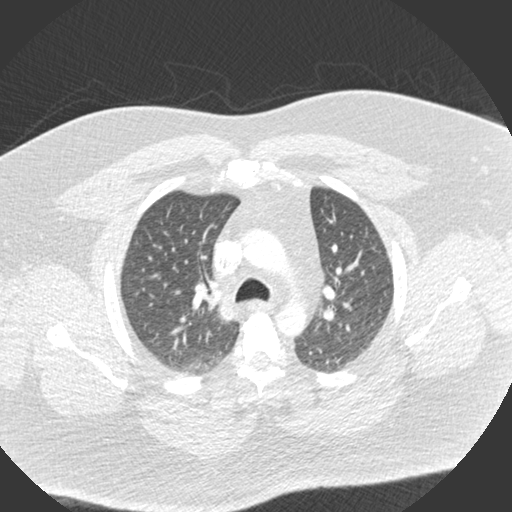
[im 346/433  mediastinal]
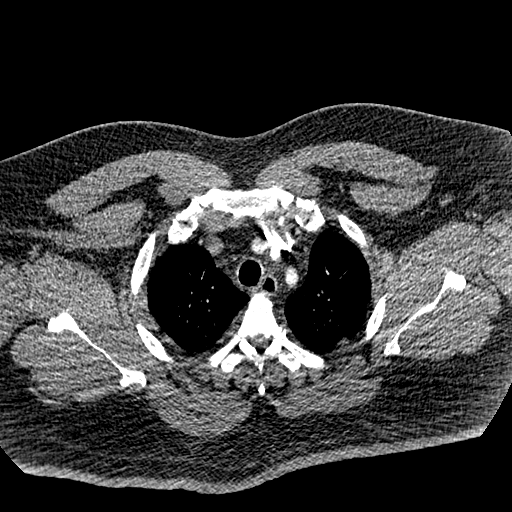
[im 368/433  lung]
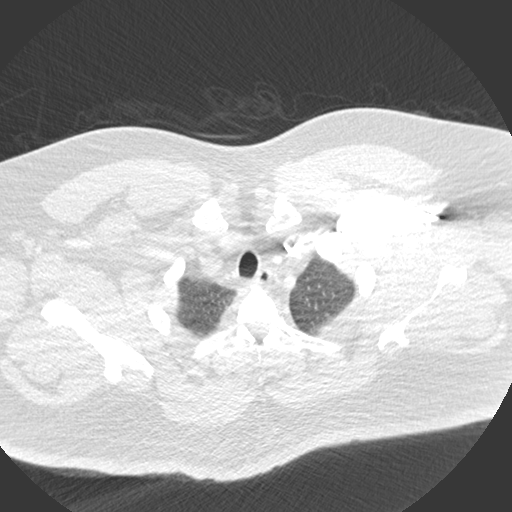
[im 389/433  mediastinal]
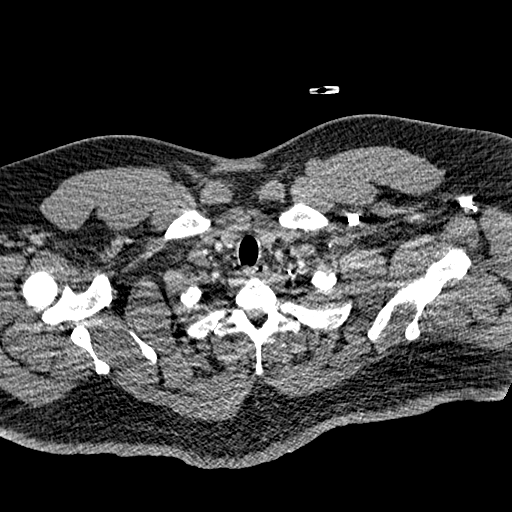
[im 411/433  lung]
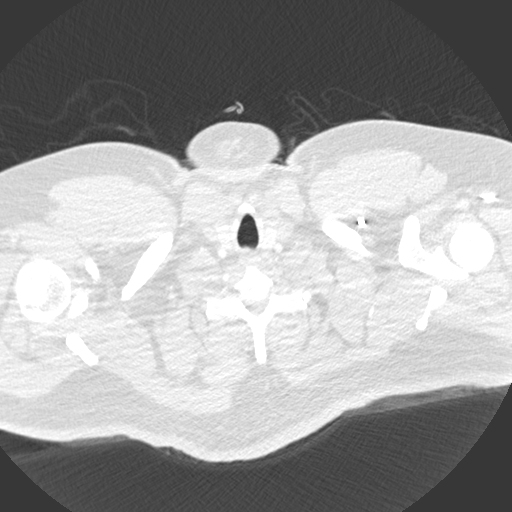

[Series 7: pe coronal mpr · coronal · 0.65mm/px · 1 of 97 slices shown]
[im 49/97  mediastinal]
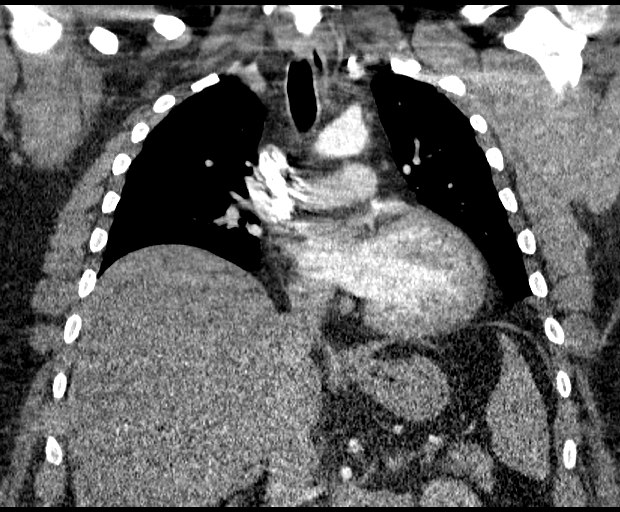

[18 of 36 positions shown; findings below may reference images not displayed]

FINDINGS: Cardiovascular: There is suboptimal opacification of the central
pulmonary arteries. As such, the examination is only capable of
excluding intraluminal filling defect within the main and central
right and left pulmonary arteries, of which there is none. The
lobar, segmental, and subsegmental pulmonary arteries are not
opacified adequately to reliably exclude the presence of small
pulmonary emboli. Central pulmonary arterial caliber is within
normal limits. Cardiac size is within normal limits. No pericardial
effusion. No significant coronary artery calcification. Thoracic
aorta is unremarkable.

Mediastinum/Nodes: No pathologic thoracic adenopathy. Thyroid
unremarkable. Esophagus unremarkable.

Lungs/Pleura: Lungs are clear. No pneumothorax or pleural effusion.
Central airways are widely patent.

Upper Abdomen: No acute abnormality.

Musculoskeletal: No acute bone abnormality.

Review of the MIP images confirms the above findings.
IMPRESSION: 1. Suboptimal opacification of the central pulmonary arteries. As
such, the examination is only capable of excluding intraluminal
filling defect within the main and central right and left pulmonary
arteries, of which there is none. The lobar, segmental, and
subsegmental pulmonary arteries are not adequately opacified to
reliably exclude the presence of small pulmonary emboli.
2. Otherwise, unremarkable CT angiogram of the chest.
